# Patient Record
Sex: Female | Born: 1970 | Race: White | Hispanic: No | Marital: Married | Smoking: Never smoker
Health system: Southern US, Community
[De-identification: ages and names within clinical notes are randomized; demographics above are authoritative.]

## PROBLEM LIST (undated history)

## (undated) DIAGNOSIS — T8859XA Other complications of anesthesia, initial encounter: Secondary | ICD-10-CM

## (undated) DIAGNOSIS — Z9889 Other specified postprocedural states: Secondary | ICD-10-CM

## (undated) DIAGNOSIS — R011 Cardiac murmur, unspecified: Secondary | ICD-10-CM

## (undated) DIAGNOSIS — R112 Nausea with vomiting, unspecified: Secondary | ICD-10-CM

## (undated) HISTORY — PX: VAGINAL HYSTERECTOMY: SHX2639

## (undated) HISTORY — PX: TONSILLECTOMY: SUR1361

---

## 2014-05-05 DIAGNOSIS — E785 Hyperlipidemia, unspecified: Secondary | ICD-10-CM | POA: Insufficient documentation

## 2015-05-18 DIAGNOSIS — N809 Endometriosis, unspecified: Secondary | ICD-10-CM | POA: Insufficient documentation

## 2016-06-24 DIAGNOSIS — K824 Cholesterolosis of gallbladder: Secondary | ICD-10-CM | POA: Insufficient documentation

## 2016-06-24 DIAGNOSIS — G43909 Migraine, unspecified, not intractable, without status migrainosus: Secondary | ICD-10-CM | POA: Insufficient documentation

## 2020-03-12 ENCOUNTER — Other Ambulatory Visit: Payer: Self-pay

## 2020-03-12 DIAGNOSIS — Z20822 Contact with and (suspected) exposure to covid-19: Secondary | ICD-10-CM

## 2020-03-14 LAB — SARS-COV-2, NAA 2 DAY TAT

## 2020-03-14 LAB — NOVEL CORONAVIRUS, NAA: SARS-CoV-2, NAA: DETECTED — AB

## 2020-03-16 ENCOUNTER — Other Ambulatory Visit: Payer: Self-pay

## 2020-05-16 ENCOUNTER — Ambulatory Visit: Payer: Managed Care, Other (non HMO) | Admitting: Osteopathic Medicine

## 2020-05-21 ENCOUNTER — Encounter: Payer: Self-pay | Admitting: Osteopathic Medicine

## 2020-05-21 ENCOUNTER — Ambulatory Visit (INDEPENDENT_AMBULATORY_CARE_PROVIDER_SITE_OTHER): Payer: Managed Care, Other (non HMO) | Admitting: Osteopathic Medicine

## 2020-05-21 ENCOUNTER — Other Ambulatory Visit: Payer: Self-pay

## 2020-05-21 VITALS — BP 135/77 | HR 67 | Temp 97.6°F | Wt 149.0 lb

## 2020-05-21 DIAGNOSIS — F419 Anxiety disorder, unspecified: Secondary | ICD-10-CM | POA: Diagnosis not present

## 2020-05-21 DIAGNOSIS — Z0000 Encounter for general adult medical examination without abnormal findings: Secondary | ICD-10-CM

## 2020-05-21 DIAGNOSIS — N941 Unspecified dyspareunia: Secondary | ICD-10-CM

## 2020-05-21 DIAGNOSIS — N951 Menopausal and female climacteric states: Secondary | ICD-10-CM | POA: Diagnosis not present

## 2020-05-21 DIAGNOSIS — Z90710 Acquired absence of both cervix and uterus: Secondary | ICD-10-CM | POA: Diagnosis not present

## 2020-05-21 DIAGNOSIS — Z780 Asymptomatic menopausal state: Secondary | ICD-10-CM | POA: Diagnosis not present

## 2020-05-21 DIAGNOSIS — R232 Flushing: Secondary | ICD-10-CM | POA: Diagnosis not present

## 2020-05-21 NOTE — Patient Instructions (Signed)
Labs ordered for future visit. Annual physical / preventive care was NOT performed or billed today.   If you decide you want Rx for estrogen pill/ patch (systemic) for hot flashes or cream/ suppository (vaginal) for menopausal issues, let me know!

## 2020-05-21 NOTE — Progress Notes (Signed)
Christina Peters is a 50 y.o. female who presents to  [facility] at [facility]  today, 05/21/20, seeking care for the following:  . Establish care . Menopause: s/p hysterectomy and single oopharectomy, (+)hot flashes ongoing a few months now, have tapered off somewhat  . Anxiety: on and off for years, not sure about wanting to be on medication for this.      ASSESSMENT & PLAN with other pertinent findings:  The primary encounter diagnosis was Postmenopausal. Diagnoses of Hot flashes, Dyspareunia in female, H/O hysterectomy for benign disease, Anxiety, and Annual physical exam were also pertinent to this visit.    1. Postmenopausal 2. Hot flashes 3. Dyspareunia in female discussed risk/benefits for estrogen replacement therapy vs SSRI vs no treatment  4. H/O hysterectomy for benign disease  5. Anxiety discsued risk/benefit SSRI/other vs no treatment, pt would like to hold off on Rx at thist ime  6. Annual physical exam Labs ordered for future visit. Annual physical / preventive care was NOT performed or billed today.     Patient Instructions  Labs ordered for future visit. Annual physical / preventive care was NOT performed or billed today.   If you decide you want Rx for estrogen pill/ patch (systemic) for hot flashes or cream/ suppository (vaginal) for menopausal issues, let me know!      Orders Placed This Encounter  Procedures  . CBC  . COMPLETE METABOLIC PANEL WITH GFR  . Lipid panel  . VITAMIN D 25 Hydroxy (Vit-D Deficiency, Fractures)    No orders of the defined types were placed in this encounter.    See below for relevant physical exam findings  See below for recent lab and imaging results reviewed  Medications, allergies, PMH, PSH, SocH, FamH reviewed below    Follow-up instructions: Return in about 4 months (around 09/20/2020) for [place] (labs 2+ days prior to appt, orders are  in).                                        Exam:  BP 135/77 (BP Location: Left Arm, Patient Position: Sitting, Cuff Size: Normal)   Pulse 67   Temp 97.6 F (36.4 C) (Oral)   Wt 149 lb 0.6 oz (67.6 kg)   Constitutional: VS see above. General Appearance: alert, well-developed, well-nourished, NAD  Neck: No masses, trachea midline.   Respiratory: Normal respiratory effort. no wheeze, no rhonchi, no rales  Cardiovascular: S1/S2 normal, no murmur, no rub/gallop auscultated. RRR.   Musculoskeletal: Gait normal. Symmetric and independent movement of all extremities  Abdominal: non-tender, non-distended, no appreciable organomegaly, neg Murphy's, BS WNLx4  Neurological: Normal balance/coordination. No tremor.  Skin: warm, dry, intact.   Psychiatric: Normal judgment/insight. Normal mood and affect. Oriented x3.   No outpatient medications have been marked as taking for the 05/21/20 encounter (Office Visit) with Sunnie Nielsen, DO.    Allergies  Allergen Reactions  . Azithromycin Hives  . Erythromycin Hives  . Erythromycin Base Swelling    There are no problems to display for this patient.   Family History  Problem Relation Age of Onset  . High blood pressure Mother   . Skin cancer Mother   . Heart attack Father   . Parkinson's disease Father     Social History   Tobacco Use  Smoking Status Never Smoker  Smokeless Tobacco Never Used  Past Surgical History:  Procedure Laterality Date  . VAGINAL HYSTERECTOMY      Immunization History  Administered Date(s) Administered  . Influenza Split 10/18/2013  . Influenza,inj,Quad PF,6-35 Mos 11/18/2017, 11/22/2018, 11/21/2019  . Influenza,inj,quad, With Preservative 10/18/2013  . PFIZER(Purple Top)SARS-COV-2 Vaccination 04/30/2019, 05/21/2019  . Tdap 02/17/2014    Recent Results (from the past 2160 hour(s))  Novel Coronavirus, NAA ([facility])     Status: Abnormal   Collection  Time: 03/12/20  2:42 PM   Specimen: Nasopharyngeal(NP) swabs in vial transport medium   Nasopharynge  Result Value Ref Range   SARS-CoV-2, NAA Detected (A) Not Detected    Comment: Patients who have a positive COVID-19 test result may now have treatment options. Treatment options are available for patients with mild to moderate symptoms and for hospitalized patients. Visit our website at [email or web address] for resources and information. This nucleic acid amplification test was developed and its performance characteristics determined by [employer]. Nucleic acid amplification tests include RT-PCR and TMA. This test has not been FDA cleared or approved. This test has been authorized by FDA under an Emergency Use Authorization (EUA). This test is only authorized for the duration of time the declaration that circumstances exist justifying the authorization of the emergency use of in vitro diagnostic tests for detection of SARS-CoV-2 virus and/or diagnosis of COVID-19 infection under section 564(b)(1) of the Act, 21 U.S.C. 174BSW-9(Q) (1), unless the authorization is terminated or revoked sooner. When diagnostic testing is negativ e, the possibility of a false negative result should be considered in the context of a patient's recent exposures and the presence of clinical signs and symptoms consistent with COVID-19. An individual without symptoms of COVID-19 and who is not shedding SARS-CoV-2 virus would expect to have a negative (not detected) result in this assay.   SARS-COV-2, NAA 2 DAY TAT     Status: None   Collection Time: 03/12/20  2:42 PM   Nasopharynge  Result Value Ref Range   SARS-CoV-2, NAA 2 DAY TAT Performed     No results found.     All questions at time of visit were answered - patient instructed to contact office with any additional concerns or updates. ER/RTC precautions were reviewed with the patient as applicable.   Please note: manual  typing as well as voice recognition software may have been used to produce this document - typos may escape review. Please contact Dr. Lyn Hollingshead for any needed clarifications.

## 2020-05-28 ENCOUNTER — Encounter: Payer: Self-pay | Admitting: Osteopathic Medicine

## 2020-06-21 ENCOUNTER — Telehealth: Payer: Self-pay

## 2020-06-21 NOTE — Telephone Encounter (Signed)
Pt called stating she has new insurance coverage. Requesting a call back so that she may submit her new insurance information. Thanks in advance.

## 2020-06-21 NOTE — Telephone Encounter (Signed)
LVM for patient to call me back to see if she can email me a copy of her updated insurance. AM

## 2020-08-22 ENCOUNTER — Other Ambulatory Visit: Payer: Self-pay

## 2020-08-22 DIAGNOSIS — Z1211 Encounter for screening for malignant neoplasm of colon: Secondary | ICD-10-CM

## 2020-08-22 NOTE — Progress Notes (Signed)
Cologuard ordered

## 2020-08-30 LAB — COMPLETE METABOLIC PANEL WITH GFR
AG Ratio: 1.7 (calc) (ref 1.0–2.5)
ALT: 11 U/L (ref 6–29)
AST: 14 U/L (ref 10–35)
Albumin: 4.4 g/dL (ref 3.6–5.1)
Alkaline phosphatase (APISO): 51 U/L (ref 37–153)
BUN: 23 mg/dL (ref 7–25)
CO2: 26 mmol/L (ref 20–32)
Calcium: 9.1 mg/dL (ref 8.6–10.4)
Chloride: 106 mmol/L (ref 98–110)
Creat: 0.71 mg/dL (ref 0.50–1.03)
Globulin: 2.6 g/dL (calc) (ref 1.9–3.7)
Glucose, Bld: 94 mg/dL (ref 65–99)
Potassium: 3.9 mmol/L (ref 3.5–5.3)
Sodium: 141 mmol/L (ref 135–146)
Total Bilirubin: 0.4 mg/dL (ref 0.2–1.2)
Total Protein: 7 g/dL (ref 6.1–8.1)
eGFR: 103 mL/min/{1.73_m2} (ref >=60)

## 2020-08-30 LAB — CBC
HCT: 38.6 % (ref 35.0–45.0)
Hemoglobin: 12.7 g/dL (ref 11.7–15.5)
MCH: 29.7 pg (ref 27.0–33.0)
MCHC: 32.9 g/dL (ref 32.0–36.0)
MCV: 90.4 fL (ref 80.0–100.0)
MPV: 11.4 fL (ref 7.5–12.5)
Platelets: 139 10*3/uL — ABNORMAL LOW (ref 140–400)
RBC: 4.27 10*6/uL (ref 3.80–5.10)
RDW: 12.8 % (ref 11.0–15.0)
WBC: 3.8 10*3/uL (ref 3.8–10.8)

## 2020-08-30 LAB — LIPID PANEL
Cholesterol: 219 mg/dL — ABNORMAL HIGH (ref <200)
HDL: 61 mg/dL (ref >=50)
LDL Cholesterol (Calc): 139 mg/dL (calc) — ABNORMAL HIGH
Non-HDL Cholesterol (Calc): 158 mg/dL (calc) — ABNORMAL HIGH (ref <130)
Total CHOL/HDL Ratio: 3.6 (calc) (ref <5.0)
Triglycerides: 93 mg/dL (ref <150)

## 2020-09-20 ENCOUNTER — Encounter: Payer: Managed Care, Other (non HMO) | Admitting: Osteopathic Medicine

## 2020-10-01 ENCOUNTER — Other Ambulatory Visit: Payer: Self-pay

## 2020-10-01 ENCOUNTER — Ambulatory Visit (INDEPENDENT_AMBULATORY_CARE_PROVIDER_SITE_OTHER): Payer: Managed Care, Other (non HMO) | Admitting: Osteopathic Medicine

## 2020-10-01 VITALS — BP 122/85 | HR 64 | Temp 97.7°F | Wt 147.0 lb

## 2020-10-01 DIAGNOSIS — Z1231 Encounter for screening mammogram for malignant neoplasm of breast: Secondary | ICD-10-CM

## 2020-10-01 DIAGNOSIS — Z0000 Encounter for general adult medical examination without abnormal findings: Secondary | ICD-10-CM

## 2020-10-01 DIAGNOSIS — E782 Mixed hyperlipidemia: Secondary | ICD-10-CM

## 2020-10-01 DIAGNOSIS — Z1211 Encounter for screening for malignant neoplasm of colon: Secondary | ICD-10-CM

## 2020-10-01 DIAGNOSIS — Z780 Asymptomatic menopausal state: Secondary | ICD-10-CM | POA: Insufficient documentation

## 2020-10-01 MED ORDER — ESTRADIOL 1 MG PO TABS: 1.0000 mg | ORAL_TABLET | Freq: Every day | ORAL | 3 refills | Status: DC

## 2020-10-01 NOTE — Progress Notes (Signed)
Christina Peters is a 50 y.o. female who presents to  [facility] at [facility]  today, 10/01/20, seeking care for the following:  Annual physical  Menopausal symptoms - hot flashes and some dyspareunia, would like to start estrogen      ASSESSMENT & PLAN with other pertinent findings:  The primary encounter diagnosis was Annual physical exam. Diagnoses of Mixed hyperlipidemia, Screening for colon cancer, Encounter for screening mammogram for malignant neoplasm of breast, and Menopause were also pertinent to this visit.   The 10-year ASCVD risk score (Goff DC Jr., et al., 2013) is: 1.2%   Values used to calculate the score:     Age: 50 years     Sex: Female     Is Non-Hispanic African American: No     Diabetic: No     Tobacco smoker: No     Systolic Blood Pressure: 122 mmHg     Is BP treated: No     HDL Cholesterol: 61 mg/dL     Total Cholesterol: 219 mg/dL  Patient Instructions  General Preventive Care Most recent routine screening labs: see attached.  Blood pressure goal 130/80 or less.  Tobacco: don't!  Alcohol: responsible moderation is ok for most adults - if you have concerns about your alcohol intake, please talk to me!  Exercise: as tolerated to reduce risk of cardiovascular disease and diabetes. Strength training will also prevent osteoporosis.  Mental health: if need for mental health care (medicines, counseling, other), or concerns about moods, please let me know!  Sexual / Reproductive health: if need for STD testing, or if concerns with libido/pain problems, please let me know!  Advanced Directive: Living Will and/or Healthcare Power of Attorney recommended for all adults, regardless of age or health.  Vaccines Flu vaccine: for almost everyone, every fall.  Shingles vaccine: recommended after age 50.  Pneumonia vaccines: after age 65. Tetanus booster: every 10 years  COVID vaccine: THANKS for getting your vaccine! :)  BOOSTER(S) STRONGLY RECOMMENDED  Cancer screenings  Colon cancer screening: Cologuard stool test ordered. If negative, repeat in 3 years. If positive, need colonoscopy to further evaluate.  Breast cancer screening: mammogram annually after age 50.  Cervical cancer screening: Pap not needed w/ hysterectomy.  Lung cancer screening: not needed for non-smokers  Infection screenings  HIV: recommended screening at least once age 18-65, more often as needed. Gonorrhea/Chlamydia: screening as needed Hepatitis C: recommended once for everyone age 18-75 TB: certain at-risk populations, or depending on work requirements and/or travel history Other Bone Density Test: recommended at age 65  Orders Placed This Encounter  Procedures   MM 3D SCREEN BREAST BILATERAL   Cologuard    Meds ordered this encounter  Medications   estradiol (ESTRACE) 1 MG tablet    Sig: Take 1 tablet (1 mg total) by mouth daily.    Dispense:  90 tablet    Refill:  3      See below for relevant physical exam findings  See below for recent lab and imaging results reviewed  Medications, allergies, PMH, PSH, SocH, FamH reviewed below    Follow-up instructions: Return in about 1 year (around 10/01/2021) for ROUTINE ANNUAL PHYSICAL (OR SOONER IF NEEDED), MYCHART SET TO ALERT YOU TO CALL TO SCHEDULE.                                          Exam:  BP 122/85 (BP Location: Left Arm, Patient Position: Sitting, Cuff Size: Normal)   Pulse 64   Temp 97.7 F (36.5 C) (Oral)   Wt 147 lb 0.6 oz (66.7 kg)  Constitutional: VS see above. General Appearance: alert, well-developed, well-nourished, NAD Neck: No masses, trachea midline.  Respiratory: Normal respiratory effort. no wheeze, no rhonchi, no rales Cardiovascular: S1/S2 normal, no murmur, no rub/gallop auscultated. RRR.  Musculoskeletal: Gait normal. Symmetric and independent movement of all extremities Abdominal: non-tender,  non-distended, no appreciable organomegaly, neg Murphy's, BS WNLx4 Neurological: Normal balance/coordination. No tremor. Skin: warm, dry, intact.  Psychiatric: Normal judgment/insight. Normal mood and affect. Oriented x3.   Current Meds  Medication Sig   estradiol (ESTRACE) 1 MG tablet Take 1 tablet (1 mg total) by mouth daily.    Allergies  Allergen Reactions   Azithromycin Hives   Erythromycin Hives   Erythromycin Base Swelling    Patient Active Problem List   Diagnosis Date Noted   Menopause 10/01/2020   Migraine 06/24/2016   Polyp of gallbladder 06/24/2016   Endometriosis 05/18/2015   Hyperlipidemia 05/05/2014    Family History  Problem Relation Age of Onset   High blood pressure Mother    Skin cancer Mother    Heart attack Father    Parkinson's disease Father     Social History   Tobacco Use  Smoking Status Never  Smokeless Tobacco Never    Past Surgical History:  Procedure Laterality Date   VAGINAL HYSTERECTOMY      Immunization History  Administered Date(s) Administered   Influenza Split 10/18/2013   Influenza,inj,Quad PF,6-35 Mos 11/18/2017, 11/22/2018, 11/21/2019   Influenza,inj,quad, With Preservative 10/18/2013   PFIZER(Purple Top)SARS-COV-2 Vaccination 04/30/2019, 05/21/2019   Tdap 02/17/2014    Recent Results (from the past 2160 hour(s))  CBC     Status: Abnormal   Collection Time: 08/29/20  7:21 AM  Result Value Ref Range   WBC 3.8 3.8 - 10.8 Thousand/uL   RBC 4.27 3.80 - 5.10 Million/uL   Hemoglobin 12.7 11.7 - 15.5 g/dL   HCT 38.6 35.0 - 45.0 %   MCV 90.4 80.0 - 100.0 fL   MCH 29.7 27.0 - 33.0 pg   MCHC 32.9 32.0 - 36.0 g/dL   RDW 12.8 11.0 - 15.0 %   Platelets 139 (L) 140 - 400 Thousand/uL   MPV 11.4 7.5 - 12.5 fL  COMPLETE METABOLIC PANEL WITH GFR     Status: None   Collection Time: 08/29/20  7:21 AM  Result Value Ref Range   Glucose, Bld 94 65 - 99 mg/dL    Comment: .            Fasting reference interval .    BUN 23 7 -  25 mg/dL   Creat 0.71 0.50 - 1.03 mg/dL   eGFR 103 > OR = 60 mL/min/1.73m2    Comment: The eGFR is based on the CKD-EPI 2021 equation. To calculate  the new eGFR from a previous Creatinine or Cystatin C result, go to https://www.kidney.org/professionals/ kdoqi/gfr%5Fcalculator    BUN/Creatinine Ratio NOT APPLICABLE 6 - 22 (calc)   Sodium 141 135 - 146 mmol/L   Potassium 3.9 3.5 - 5.3 mmol/L   Chloride 106 98 - 110 mmol/L   CO2 26 20 - 32 mmol/L   Calcium 9.1 8.6 - 10.4 mg/dL   Total Protein 7.0 6.1 - 8.1 g/dL   Albumin 4.4 3.6 - 5.1 g/dL   Globulin 2.6 1.9 - 3.7 g/dL (calc)     AG Ratio 1.7 1.0 - 2.5 (calc)   Total Bilirubin 0.4 0.2 - 1.2 mg/dL   Alkaline phosphatase (APISO) 51 37 - 153 U/L   AST 14 10 - 35 U/L   ALT 11 6 - 29 U/L  Lipid panel     Status: Abnormal   Collection Time: 08/29/20  7:21 AM  Result Value Ref Range   Cholesterol 219 (H) <200 mg/dL   HDL 61 > OR = 50 mg/dL   Triglycerides 93 <150 mg/dL   LDL Cholesterol (Calc) 139 (H) mg/dL (calc)    Comment: Reference range: <100 . Desirable range <100 mg/dL for primary prevention;   <70 mg/dL for patients with CHD or diabetic patients  with > or = 2 CHD risk factors. . LDL-C is now calculated using the Martin-Hopkins  calculation, which is a validated novel method providing  better accuracy than the Friedewald equation in the  estimation of LDL-C.  Martin SS et al. JAMA. 2013;310(19): 2061-2068  (http://education.[employer].com/faq/FAQ164)    Total CHOL/HDL Ratio 3.6 <5.0 (calc)   Non-HDL Cholesterol (Calc) 158 (H) <130 mg/dL (calc)    Comment: For patients with diabetes plus 1 major ASCVD risk  factor, treating to a non-HDL-C goal of <100 mg/dL  (LDL-C of <70 mg/dL) is considered a therapeutic  option.     No results found.     All questions at time of visit were answered - patient instructed to contact office with any additional concerns or updates. ER/RTC precautions were reviewed with the  patient as applicable.   Please note: manual typing as well as voice recognition software may have been used to produce this document - typos may escape review. Please contact Dr. Alexander for any needed clarifications.   

## 2020-10-01 NOTE — Patient Instructions (Signed)
General Preventive Care Most recent routine screening labs: see attached.  Blood pressure goal 130/80 or less.  Tobacco: don't!  Alcohol: responsible moderation is ok for most adults - if you have concerns about your alcohol intake, please talk to me!  Exercise: as tolerated to reduce risk of cardiovascular disease and diabetes. Strength training will also prevent osteoporosis.  Mental health: if need for mental health care (medicines, counseling, other), or concerns about moods, please let me know!  Sexual / Reproductive health: if need for STD testing, or if concerns with libido/pain problems, please let me know!  Advanced Directive: Living Will and/or Healthcare Power of Attorney recommended for all adults, regardless of age or health.  Vaccines Flu vaccine: for almost everyone, every fall.  Shingles vaccine: recommended after age 86.  Pneumonia vaccines: after age 86. Tetanus booster: every 10 years  COVID vaccine: THANKS for getting your vaccine! :) BOOSTER(S) STRONGLY RECOMMENDED  Cancer screenings  Colon cancer screening: Cologuard stool test ordered. If negative, repeat in 3 years. If positive, need colonoscopy to further evaluate.  Breast cancer screening: mammogram annually after age 2.  Cervical cancer screening: Pap not needed w/ hysterectomy.  Lung cancer screening: not needed for non-smokers  Infection screenings  HIV: recommended screening at least once age 36-65, more often as needed. Gonorrhea/Chlamydia: screening as needed Hepatitis C: recommended once for everyone age 28-75 TB: certain at-risk populations, or depending on work requirements and/or travel history Other Bone Density Test: recommended at age 109

## 2020-10-13 LAB — COLOGUARD: Cologuard: NEGATIVE

## 2020-11-27 ENCOUNTER — Other Ambulatory Visit: Payer: Self-pay | Admitting: Physician Assistant

## 2020-11-27 DIAGNOSIS — Z1231 Encounter for screening mammogram for malignant neoplasm of breast: Secondary | ICD-10-CM

## 2020-11-28 ENCOUNTER — Other Ambulatory Visit: Payer: Self-pay | Admitting: Medical-Surgical

## 2020-11-28 DIAGNOSIS — Z1231 Encounter for screening mammogram for malignant neoplasm of breast: Secondary | ICD-10-CM

## 2020-12-18 ENCOUNTER — Ambulatory Visit: Payer: Managed Care, Other (non HMO) | Admitting: Family Medicine

## 2020-12-24 ENCOUNTER — Ambulatory Visit: Payer: Managed Care, Other (non HMO)

## 2020-12-24 ENCOUNTER — Other Ambulatory Visit: Payer: Self-pay

## 2020-12-24 ENCOUNTER — Ambulatory Visit
Admission: RE | Admit: 2020-12-24 | Discharge: 2020-12-24 | Disposition: A | Discharge status: Home / Self Care | Payer: Managed Care, Other (non HMO) | Source: Ambulatory Visit | Attending: Medical-Surgical | Admitting: Medical-Surgical

## 2021-02-08 ENCOUNTER — Ambulatory Visit: Payer: Managed Care, Other (non HMO) | Admitting: Physician Assistant

## 2021-02-13 ENCOUNTER — Other Ambulatory Visit: Payer: Self-pay

## 2021-02-13 ENCOUNTER — Encounter: Payer: Self-pay | Admitting: Physician Assistant

## 2021-02-13 ENCOUNTER — Ambulatory Visit: Payer: Managed Care, Other (non HMO) | Admitting: Physician Assistant

## 2021-02-13 VITALS — BP 116/74 | HR 58 | Ht 63.5 in | Wt 151.0 lb

## 2021-02-13 DIAGNOSIS — R1114 Bilious vomiting: Secondary | ICD-10-CM | POA: Diagnosis not present

## 2021-02-13 DIAGNOSIS — R195 Other fecal abnormalities: Secondary | ICD-10-CM

## 2021-02-13 DIAGNOSIS — R1011 Right upper quadrant pain: Secondary | ICD-10-CM | POA: Diagnosis not present

## 2021-02-13 MED ORDER — ONDANSETRON 8 MG PO TBDP: 8.0000 mg | ORAL_TABLET | Freq: Three times a day (TID) | ORAL | 0 refills | Status: DC | PRN

## 2021-02-13 NOTE — Progress Notes (Signed)
° °  Subjective:    Patient ID: Christina Peters, female    DOB: 24-Aug-1970, 49 y.o.   MRN: [id number]  HPI Pt is a 50 yo female who presents to the clinic with 6 months of right upper quadrant intermittent discomfort, nausea, vomiting, stool changes. 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 She has had moments of worse discomfort after eating [employer] pizza sub. She is able to control a lot of her symptoms until recently when many things she eats causes her to feel bloated and nauseated. Denies any urinary changes or symptoms or melena or hematochezia. No fever, chills, body aches. No hx of reflux and denies any reflux symptoms.   .. Active Ambulatory Problems    Diagnosis Date Noted   Endometriosis 05/18/2015   Hyperlipidemia 05/05/2014   Migraine 06/24/2016   Polyp of gallbladder 06/24/2016   Menopause 10/01/2020   Resolved Ambulatory Problems    Diagnosis Date Noted   No Resolved Ambulatory Problems   No Additional Past Medical History     Review of Systems See HPI.     Objective:   Physical Exam Vitals reviewed.  Constitutional:      Appearance: Normal appearance.  HENT:     Head: Normocephalic.  Cardiovascular:     Rate and Rhythm: Normal rate and regular rhythm.     Pulses: Normal pulses.     Heart sounds: Normal heart sounds.  Pulmonary:     Effort: Pulmonary effort is normal.  Abdominal:     General: Bowel sounds are normal. There is no distension.     Palpations: Abdomen is soft. There is no mass.     Tenderness: There is abdominal tenderness. There is rebound. There is no right CVA tenderness, left CVA tenderness or guarding.     Comments: Mild Tenderness over RUQ and epigastric area but negative murphys sign.   Neurological:     Mental Status: She is alert.          Assessment & Plan:  Marland KitchenMarland KitchenAnissa was seen today for nausea.  Diagnoses and all orders for this visit:  Right upper quadrant pain -     US Abdomen Complete; Future -      COMPLETE METABOLIC PANEL WITH GFR -     Lipase -     CBC w/Diff/Platelet  Bilious vomiting with nausea -     US Abdomen Complete; Future -     COMPLETE METABOLIC PANEL WITH GFR -     Lipase -     CBC w/Diff/Platelet -     ondansetron (ZOFRAN-ODT) 8 MG disintegrating tablet; Take 1 tablet (8 mg total) by mouth every 8 (eight) hours as needed for nausea.  Change in stool -     US Abdomen Complete; Future -     COMPLETE METABOLIC PANEL WITH GFR -     Lipase -     CBC w/Diff/Platelet   Suspect some gallbladder etiology per pt she has u/s herself and saw stones. Will get official u/s and labs. ? If some GERD as well. Will hold on PPI for now. Zofran for nausea Pt aware of diet triggers to avoid Will refer to [facility] [place] [facility] if needed for surgical consult

## 2021-02-13 NOTE — Patient Instructions (Signed)
Will get u/s and labs to rule out gallstones.  Zofran as needed for nausea

## 2021-02-27 LAB — COMPLETE METABOLIC PANEL WITH GFR
AG Ratio: 1.5 (calc) (ref 1.0–2.5)
ALT: 9 U/L (ref 6–29)
AST: 13 U/L (ref 10–35)
Albumin: 4.5 g/dL (ref 3.6–5.1)
Alkaline phosphatase (APISO): 59 U/L (ref 37–153)
BUN: 23 mg/dL (ref 7–25)
CO2: 27 mmol/L (ref 20–32)
Calcium: 9.5 mg/dL (ref 8.6–10.4)
Chloride: 105 mmol/L (ref 98–110)
Creat: 0.77 mg/dL (ref 0.50–1.03)
Globulin: 3 g/dL (calc) (ref 1.9–3.7)
Glucose, Bld: 90 mg/dL (ref 65–99)
Potassium: 4.4 mmol/L (ref 3.5–5.3)
Sodium: 140 mmol/L (ref 135–146)
Total Bilirubin: 0.4 mg/dL (ref 0.2–1.2)
Total Protein: 7.5 g/dL (ref 6.1–8.1)
eGFR: 93 mL/min/{1.73_m2} (ref >=60)

## 2021-02-27 LAB — CBC WITH DIFFERENTIAL/PLATELET
Absolute Monocytes: 229 cells/uL (ref 200–950)
Basophils Absolute: 41 cells/uL (ref 0–200)
Basophils Relative: 1.1 %
Eosinophils Absolute: 41 cells/uL (ref 15–500)
Eosinophils Relative: 1.1 %
HCT: 39.1 % (ref 35.0–45.0)
Hemoglobin: 13 g/dL (ref 11.7–15.5)
Lymphs Abs: 1513 cells/uL (ref 850–3900)
MCH: 29.3 pg (ref 27.0–33.0)
MCHC: 33.2 g/dL (ref 32.0–36.0)
MCV: 88.3 fL (ref 80.0–100.0)
MPV: 11.8 fL (ref 7.5–12.5)
Monocytes Relative: 6.2 %
Neutro Abs: 1876 cells/uL (ref 1500–7800)
Neutrophils Relative %: 50.7 %
Platelets: 153 10*3/uL (ref 140–400)
RBC: 4.43 10*6/uL (ref 3.80–5.10)
RDW: 12.3 % (ref 11.0–15.0)
Total Lymphocyte: 40.9 %
WBC: 3.7 10*3/uL — ABNORMAL LOW (ref 3.8–10.8)

## 2021-02-27 LAB — LIPASE: Lipase: 16 U/L (ref 7–60)

## 2021-02-28 NOTE — Progress Notes (Signed)
Margia,   Lipase normal. Kidney, liver, glucose look great.  Hemoglobin looks good.  WBC a little low but stable from 6 months ago and differential looks great.

## 2021-03-01 ENCOUNTER — Ambulatory Visit
Admission: RE | Admit: 2021-03-01 | Discharge: 2021-03-01 | Disposition: A | Discharge status: Home / Self Care | Payer: Managed Care, Other (non HMO) | Source: Ambulatory Visit | Attending: Physician Assistant | Admitting: Physician Assistant

## 2021-03-01 ENCOUNTER — Encounter: Payer: Self-pay | Admitting: Physician Assistant

## 2021-03-01 ENCOUNTER — Other Ambulatory Visit: Payer: Self-pay | Admitting: Physician Assistant

## 2021-03-01 DIAGNOSIS — R1011 Right upper quadrant pain: Secondary | ICD-10-CM

## 2021-03-01 DIAGNOSIS — R195 Other fecal abnormalities: Secondary | ICD-10-CM

## 2021-03-01 DIAGNOSIS — K824 Cholesterolosis of gallbladder: Secondary | ICD-10-CM

## 2021-03-01 DIAGNOSIS — K8020 Calculus of gallbladder without cholecystitis without obstruction: Secondary | ICD-10-CM

## 2021-03-01 DIAGNOSIS — R1114 Bilious vomiting: Secondary | ICD-10-CM

## 2021-03-01 DIAGNOSIS — N281 Cyst of kidney, acquired: Secondary | ICD-10-CM | POA: Insufficient documentation

## 2021-03-01 NOTE — Progress Notes (Signed)
Christina Peters,  You do have a gallstone 60mm and gallbladder polyp 67mm. No signs of acute inflammation/infection.  Since you are symptomatic will make referral to [facility] [place] [facility] for consult  You do have  right renal cyst 1cm that will need follow up on in 6- 12 months.

## 2021-03-08 ENCOUNTER — Telehealth: Payer: Self-pay

## 2021-03-08 NOTE — Telephone Encounter (Signed)
Spoke to pt in regards to a referral  that was sent out 03/01/20 pt would like a follow up she states she has yet to receive a call for a schedule appt.

## 2021-04-02 ENCOUNTER — Ambulatory Visit: Payer: Self-pay | Admitting: Surgery

## 2021-05-10 NOTE — Progress Notes (Signed)
Anesthesia Review:  PCP: Cardiologist : Chest x-ray : EKG : Echo : Stress test: Cardiac Cath :  Activity level:  Sleep Study/ CPAP : Fasting Blood Sugar :      / Checks Blood Sugar -- times a day:   Blood Thinner/ Instructions /Last Dose: ASA / Instructions/ Last Dose :  

## 2021-05-13 NOTE — Progress Notes (Addendum)
? ? ? ? ?   DUE TO COVID-19 ONLY ONE VISITOR IS ALLOWED TO COME WITH YOU AND STAY IN THE WAITING ROOM ONLY DURING PRE OP AND PROCEDURE DAY OF SURGERY.  2 VISITOR  MAY VISIT WITH YOU AFTER SURGERY IN YOUR PRIVATE ROOM DURING VISITING HOURS ONLY! ?YOU MAY HAVE ONE PERSON SPEND THE NITE WITH YOU IN YOUR ROOM AFTER SURGERY.   ? ? ? ? Your procedure is scheduled on:  ?           05/30/2021.   ? Report to [facility] Main  Entrance ? ? Report to admitting at   0515             AM ?DO NOT BRING INSURANCE CARD, PICTURE ID OR WALLET DAY OF SURGERY.  ?  ? ? Call this number if you have problems the morning of surgery [phone]  ? ? REMEMBER: NO  SOLID FOODS , CANDY, GUM OR MINTS AFTER MIDNITE THE NITE BEFORE SURGERY .       Marland Kitchen CLEAR LIQUIDS UNTIL      0430am           DAY OF SURGERY.      PLEASE FINISH ENSURE DRINK PER SURGEON ORDER  WHICH NEEDS TO BE COMPLETED AT  0430am        MORNING OF SURGERY.   ? ? ? ? ?CLEAR LIQUID DIET ? ? ?Foods Allowed      ?WATER ?BLACK COFFEE ( SUGAR OK, NO MILK, CREAM OR CREAMER) REGULAR AND DECAF  ?TEA ( SUGAR OK NO MILK, CREAM, OR CREAMER) REGULAR AND DECAF  ?PLAIN JELLO ( NO RED)  ?FRUIT ICES ( NO RED, NO FRUIT PULP)  ?POPSICLES ( NO RED)  ?JUICE- APPLE, WHITE GRAPE AND WHITE CRANBERRY  ?SPORT DRINK LIKE GATORADE ( NO RED)  ?CLEAR BROTH ( VEGETABLE , CHICKEN OR BEEF)                                                               ? ?    ? ?BRUSH YOUR TEETH MORNING OF SURGERY AND RINSE YOUR MOUTH OUT, NO CHEWING GUM CANDY OR MINTS. ?  ? ? Take these medicines the morning of surgery with A SIP OF WATER:  none  ? ? ?DO NOT TAKE ANY DIABETIC MEDICATIONS DAY OF YOUR SURGERY ?                  ?            You may not have any metal on your body including hair pins and  ?            piercings  Do not wear jewelry, make-up, lotions, powders or perfumes, deodorant ?            Do not wear nail polish on your fingernails.   ?           IF YOU ARE A FEMALE AND WANT TO SHAVE UNDER ARMS OR LEGS  PRIOR TO SURGERY YOU MUST DO SO AT LEAST 48 HOURS PRIOR TO SURGERY.  ?            Men may shave face and neck. ? ? Do not bring valuables to the hospital. [facility] IS NOT ?  RESPONSIBLE   FOR VALUABLES. ? Contacts, dentures or bridgework may not be worn into surgery. ? Leave suitcase in the car. After surgery it may be brought to your room. ? ?  ? Patients discharged the day of surgery will not be allowed to drive home. IF YOU ARE HAVING SURGERY AND GOING HOME THE SAME DAY, YOU MUST HAVE AN ADULT TO DRIVE YOU HOME AND BE WITH YOU FOR 24 HOURS. YOU MAY GO HOME BY TAXI OR UBER OR ORTHERWISE, BUT AN ADULT MUST ACCOMPANY YOU HOME AND STAY WITH YOU FOR 24 HOURS. ?  ? ?            Please read over the following fact sheets you were given: ?_____________________________________________________________________ ? ?[facility] - Preparing for Surgery ?Before surgery, you can play an important role.  Because skin is not sterile, your skin needs to be as free of germs as possible.  You can reduce the number of germs on your skin by washing with CHG (chlorahexidine gluconate) soap before surgery.  CHG is an antiseptic cleaner which kills germs and bonds with the skin to continue killing germs even after washing. ?Please DO NOT use if you have an allergy to CHG or antibacterial soaps.  If your skin becomes reddened/irritated stop using the CHG and inform your nurse when you arrive at Short Stay. ?Do not shave (including legs and underarms) for at least 48 hours prior to the first CHG shower.  You may shave your face/neck. ?Please follow these instructions carefully: ? 1.  Shower with CHG Soap the night before surgery and the  morning of Surgery. ? 2.  If you choose to wash your hair, wash your hair first as usual with your  normal  shampoo. ? 3.  After you shampoo, rinse your hair and body thoroughly to remove the  shampoo.                           4.  Use CHG as you would any other liquid soap.  You can apply chg  directly  to the skin and wash  ?                     Gently with a scrungie or clean washcloth. ? 5.  Apply the CHG Soap to your body ONLY FROM THE NECK DOWN.   Do not use on face/ open      ?                     Wound or open sores. Avoid contact with eyes, ears mouth and genitals (private parts).  ?                     Production manager,  Genitals (private parts) with your normal soap. ?            6.  Wash thoroughly, paying special attention to the area where your surgery  will be performed. ? 7.  Thoroughly rinse your body with warm water from the neck down. ? 8.  DO NOT shower/wash with your normal soap after using and rinsing off  the CHG Soap. ?               9.  Pat yourself dry with a clean towel. ?           10.  Wear clean pajamas. ?  11.  Place clean sheets on your bed the night of your first shower and do not  sleep with pets. ?Day of Surgery : ?Do not apply any lotions/deodorants the morning of surgery.  Please wear clean clothes to the hospital/surgery center. ? ?FAILURE TO FOLLOW THESE INSTRUCTIONS MAY RESULT IN THE CANCELLATION OF YOUR SURGERY ?PATIENT SIGNATURE_________________________________ ? ?NURSE SIGNATURE__________________________________ ? ?________________________________________________________________________  ? ? ?           ?

## 2021-05-14 ENCOUNTER — Encounter (HOSPITAL_COMMUNITY)
Admission: RE | Admit: 2021-05-14 | Discharge: 2021-05-14 | Disposition: A | Discharge status: Home / Self Care | Payer: Managed Care, Other (non HMO) | Source: Ambulatory Visit | Attending: Surgery | Admitting: Surgery

## 2021-05-14 ENCOUNTER — Other Ambulatory Visit: Payer: Self-pay

## 2021-05-14 ENCOUNTER — Encounter (HOSPITAL_COMMUNITY): Payer: Self-pay

## 2021-05-14 VITALS — BP 136/85 | HR 68 | Temp 98.5°F | Resp 14 | Ht 64.0 in | Wt 156.0 lb

## 2021-05-14 DIAGNOSIS — Z01812 Encounter for preprocedural laboratory examination: Secondary | ICD-10-CM | POA: Insufficient documentation

## 2021-05-14 DIAGNOSIS — Z01818 Encounter for other preprocedural examination: Secondary | ICD-10-CM

## 2021-05-14 HISTORY — DX: Other specified postprocedural states: Z98.890

## 2021-05-14 HISTORY — DX: Cardiac murmur, unspecified: R01.1

## 2021-05-14 HISTORY — DX: Nausea with vomiting, unspecified: R11.2

## 2021-05-14 HISTORY — DX: Other complications of anesthesia, initial encounter: T88.59XA

## 2021-05-14 LAB — CBC
HCT: 36.9 % (ref 36.0–46.0)
Hemoglobin: 12.6 g/dL (ref 12.0–15.0)
MCH: 30.2 pg (ref 26.0–34.0)
MCHC: 34.1 g/dL (ref 30.0–36.0)
MCV: 88.5 fL (ref 80.0–100.0)
Platelets: 165 10*3/uL (ref 150–400)
RBC: 4.17 MIL/uL (ref 3.87–5.11)
RDW: 12.9 % (ref 11.5–15.5)
WBC: 5.5 10*3/uL (ref 4.0–10.5)
nRBC: 0 % (ref 0.0–0.2)

## 2021-05-24 ENCOUNTER — Encounter (HOSPITAL_COMMUNITY): Payer: Self-pay | Admitting: Surgery

## 2021-05-24 DIAGNOSIS — K8050 Calculus of bile duct without cholangitis or cholecystitis without obstruction: Secondary | ICD-10-CM | POA: Diagnosis present

## 2021-05-24 NOTE — H&P (Signed)
? ? ? ? ?REFERRING PHYSICIAN: Tandy Gaw, PA ? ?PROVIDER: Analiese Krupka Myra Rude, MD ? ?Chief Complaint: New Consultation (Symptomatic cholelithiasis, gallbladder polyp) ? ? ?History of Present Illness: ? ?Patient is referred by Tandy Gaw, PA C, for surgical evaluation and management of symptomatic cholelithiasis and gallbladder polyp. 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 She has a known gallbladder polyp for approximately 5 years. She has known cholelithiasis for approximately 2-1/2 years. Over the past year she has developed intermittent symptoms of epigastric and right upper quadrant abdominal discomfort and bloating, nausea, and occasional emesis. These appear to be related to meals although not to any particular type of food. They also occur late at night after a late meal. They may last for several hours. There is no history of jaundice or acholic stools. There is no fever or chills. Ultrasound examination on March 01, 2021 shows cholelithiasis with stones measuring up to 11 mm. There is also a gallbladder polyp measuring 3 mm. There are no acute inflammatory changes. Previous abdominal surgery includes hysterectomy. There is a family history of gallbladder disease in a maternal grandmother and a history of cholecystectomy in the patient's daughter at age 89 years due to biliary dyskinesia. Patient now presents for consideration for cholecystectomy for symptomatic cholelithiasis and gallbladder polyp. ? ?Review of Systems: ?A complete review of systems was obtained from the patient. I have reviewed this information and discussed as appropriate with the patient. See HPI as well for other ROS. ? ?Review of Systems  ?Constitutional: Negative.  ?HENT: Negative.  ?Eyes: Negative.  ?Respiratory: Negative.  ?Cardiovascular: Negative.  ?Gastrointestinal: Positive for abdominal pain, nausea and vomiting.  ?Genitourinary: Negative.  ?Musculoskeletal: Negative.  ?Skin: Negative.   ?Neurological: Negative.  ?Endo/Heme/Allergies: Negative.  ?Psychiatric/Behavioral: Negative.  ? ? ?Medical History: ?History reviewed. No pertinent past medical history. ? ?Patient Active Problem List  ?Diagnosis  ? Calculus of gallbladder without cholecystitis without obstruction  ? Biliary colic  ? Gallbladder polyp  ? ?Past Surgical History:  ?Procedure Laterality Date  ? COMBINED HYSTERECTOMY VAGINAL / OOPHORECTOMY / A&P REPAIR  ?2016  ? TONSILLECTOMY  ? ? ?Allergies  ?Allergen Reactions  ? Erythromycin Base Swelling  ? ?Current Outpatient Medications on File Prior to Visit  ?Medication Sig Dispense Refill  ? estradioL (ESTRACE) 1 MG tablet Take 1 mg by mouth once daily  ? ?No current facility-administered medications on file prior to visit.  ? ?Family History  ?Problem Relation Age of Onset  ? High blood pressure (Hypertension) Mother  ? High blood pressure (Hypertension) Father  ? Hyperlipidemia (Elevated cholesterol) Father  ? Coronary Artery Disease (Blocked arteries around heart) Father  ? ? ?Social History  ? ?Tobacco Use  ?Smoking Status Never  ?Smokeless Tobacco Never  ? ? ?Social History  ? ?Socioeconomic History  ? Marital status: Married  ?Tobacco Use  ? Smoking status: Never  ? Smokeless tobacco: Never  ?Vaping Use  ? Vaping Use: Never used  ?Substance and Sexual Activity  ? Alcohol use: Never  ? Drug use: Never  ? ?Objective:  ? ?Vitals:  ?BP: 120/74  ?Pulse: 84  ?Temp: 36.7 ?C (98 ?F)  ?SpO2: 99%  ?Weight: 70.5 kg (155 lb 6.4 oz)  ?Height: 162.6 cm (5\' 4" )  ? ?Body mass index is 26.67 kg/m?. ? ?Physical Exam  ? ?GENERAL APPEARANCE ?Development: normal ?Nutritional status: normal ?Gross deformities: none ? ?SKIN ?Rash, lesions, ulcers: none ?Induration, erythema: none ?Nodules: none palpable ? ?EYES ?Conjunctiva and lids: normal ?Pupils:  equal and reactive ?Iris: normal bilaterally ? ?EARS, NOSE, MOUTH, THROAT ?External ears: no lesion or deformity ?External nose: no lesion or deformity ?Hearing:  grossly normal ?Due to Covid-19 pandemic, patient is wearing a mask. ? ?NECK ?Symmetric: yes ?Trachea: midline ?Thyroid: no palpable nodules in the thyroid bed ? ?CHEST ?Respiratory effort: normal ?Retraction or accessory muscle use: no ?Breath sounds: normal bilaterally ?Rales, rhonchi, wheeze: none ? ?CARDIOVASCULAR ?Auscultation: regular rhythm, normal rate ?Murmurs: none ?Pulses: radial pulse 2+ palpable ?Lower extremity edema: none ? ?ABDOMEN ?Distension: none ?Masses: none palpable ?Tenderness: none ?Hepatosplenomegaly: not present ?Hernia: not present ? ?MUSCULOSKELETAL ?Station and gait: normal ?Digits and nails: no clubbing or cyanosis ?Muscle strength: grossly normal all extremities ?Range of motion: grossly normal all extremities ?Deformity: none ? ?LYMPHATIC ?Cervical: none palpable ?Supraclavicular: none palpable ? ?PSYCHIATRIC ?Oriented to person, place, and time: yes ?Mood and affect: normal for situation ?Judgment and insight: appropriate for situation ? ? ?Assessment and Plan:  ? ?Calculus of gallbladder without cholecystitis without obstruction ? ?Biliary colic ? ?Gallbladder polyp ? ? ?Patient presents today on referral from her primary care provider for surgical evaluation and management of symptomatic cholelithiasis and gallbladder polyp. Patient has had intermittent episodes of abdominal pain, nausea, and emesis over the past several months. This seems to be precipitated mainly by meals. Ultrasound demonstrates multiple gallstones as well as a small gallbladder polyp. ? ?Today we discussed laparoscopic cholecystectomy. We discussed performing intraoperative cholangiography. We discussed the size and location of the surgical incisions. We discussed the potential for conversion to open surgery. We discussed the hospital course to be anticipated and the postoperative recovery and return to work and activities. The patient understands and wishes to proceed with surgery in the near  future. ? ?Patient is provided with written literature on laparoscopic cholecystectomy to review at home. ? ?The risks and benefits of the procedure have been discussed at length with the patient. The patient understands the proposed procedure, potential alternative treatments, and the course of recovery to be expected. All of the patient's questions have been answered at this time. The patient wishes to proceed with surgery.  ? ?[name], MD ?[facility] ?A [facility] practice ?Office: [phone] ? ?

## 2021-05-29 NOTE — Anesthesia Preprocedure Evaluation (Addendum)
Anesthesia Evaluation  ?Patient identified by MRN, date of birth, ID band ?Patient awake ? ? ? ?Reviewed: ?Allergy & Precautions, H&P , NPO status , Patient's Chart, lab work & pertinent test results ? ?History of Anesthesia Complications ?(+) PONV and history of anesthetic complications ? ?Airway ?Mallampati: II ? ?TM Distance: >3 FB ?Neck ROM: Full ? ? ? Dental ?no notable dental hx. ?(+) Teeth Intact, Dental Advisory Given ?  ?Pulmonary ?neg pulmonary ROS,  ?  ?Pulmonary exam normal ?breath sounds clear to auscultation ? ? ? ? ? ? Cardiovascular ?Exercise Tolerance: Good ?negative cardio ROS ?Normal cardiovascular exam ?Rhythm:Regular Rate:Normal ? ? ?  ?Neuro/Psych ? Headaches, negative psych ROS  ? GI/Hepatic ?negative GI ROS, Neg liver ROS,   ?Endo/Other  ?negative endocrine ROS ? Renal/GU ?negative Renal ROS  ?negative genitourinary ?  ?Musculoskeletal ?negative musculoskeletal ROS ?(+)  ? Abdominal ?  ?Peds ?negative pediatric ROS ?(+)  Hematology ?negative hematology ROS ?(+)   ?Anesthesia Other Findings ? ? Reproductive/Obstetrics ?negative OB ROS ? ?  ? ? ? ? ? ? ? ? ? ? ? ? ? ?  ?  ? ? ? ? ? ? ? ?Anesthesia Physical ?Anesthesia Plan ? ?ASA: 2 ? ?Anesthesia Plan: General  ? ?Post-op Pain Management: Minimal or no pain anticipated  ? ?Induction:  ? ?PONV Risk Score and Plan: 3 and Ondansetron, Dexamethasone and Scopolamine patch - Pre-op ? ?Airway Management Planned: Oral ETT ? ?Additional Equipment: None ? ?Intra-op Plan:  ? ?Post-operative Plan: Extubation in OR ? ?Informed Consent: I have reviewed the patients History and Physical, chart, labs and discussed the procedure including the risks, benefits and alternatives for the proposed anesthesia with the patient or authorized representative who has indicated his/her understanding and acceptance.  ? ? ? ? ? ?Plan Discussed with: Anesthesiologist and CRNA ? ?Anesthesia Plan Comments:   ? ? ? ? ? ?Anesthesia Quick  Evaluation ? ?

## 2021-05-30 ENCOUNTER — Ambulatory Visit (HOSPITAL_COMMUNITY): Payer: Managed Care, Other (non HMO)

## 2021-05-30 ENCOUNTER — Ambulatory Visit (HOSPITAL_COMMUNITY): Payer: Managed Care, Other (non HMO) | Admitting: Anesthesiology

## 2021-05-30 ENCOUNTER — Other Ambulatory Visit: Payer: Self-pay

## 2021-05-30 ENCOUNTER — Ambulatory Visit (HOSPITAL_BASED_OUTPATIENT_CLINIC_OR_DEPARTMENT_OTHER): Payer: Managed Care, Other (non HMO) | Admitting: Anesthesiology

## 2021-05-30 ENCOUNTER — Encounter (HOSPITAL_COMMUNITY)
Admission: RE | Disposition: A | Discharge status: Home / Self Care | Payer: Self-pay | Source: Ambulatory Visit | Attending: Surgery

## 2021-05-30 ENCOUNTER — Encounter (HOSPITAL_COMMUNITY): Payer: Self-pay | Admitting: Surgery

## 2021-05-30 ENCOUNTER — Ambulatory Visit (HOSPITAL_COMMUNITY)
Admission: RE | Admit: 2021-05-30 | Discharge: 2021-05-30 | Disposition: A | Discharge status: Home / Self Care | Payer: Managed Care, Other (non HMO) | Source: Ambulatory Visit | Attending: Surgery | Admitting: Surgery

## 2021-05-30 DIAGNOSIS — K8010 Calculus of gallbladder with chronic cholecystitis without obstruction: Secondary | ICD-10-CM | POA: Diagnosis present

## 2021-05-30 DIAGNOSIS — K8050 Calculus of bile duct without cholangitis or cholecystitis without obstruction: Secondary | ICD-10-CM | POA: Diagnosis present

## 2021-05-30 DIAGNOSIS — K8020 Calculus of gallbladder without cholecystitis without obstruction: Secondary | ICD-10-CM | POA: Diagnosis present

## 2021-05-30 DIAGNOSIS — Z8379 Family history of other diseases of the digestive system: Secondary | ICD-10-CM | POA: Diagnosis not present

## 2021-05-30 DIAGNOSIS — K824 Cholesterolosis of gallbladder: Secondary | ICD-10-CM | POA: Diagnosis present

## 2021-05-30 HISTORY — PX: CHOLECYSTECTOMY: SHX55

## 2021-05-30 SURGERY — LAPAROSCOPIC CHOLECYSTECTOMY
Anesthesia: General | Site: Abdomen

## 2021-05-30 MED ORDER — BUPIVACAINE-EPINEPHRINE (PF) 0.5% -1:200000 IJ SOLN
INTRAMUSCULAR | Status: AC
  Filled 2021-05-30: qty 30

## 2021-05-30 MED ORDER — ONDANSETRON HCL 4 MG/2ML IJ SOLN
INTRAMUSCULAR | Status: AC
  Filled 2021-05-30: qty 2

## 2021-05-30 MED ORDER — ACETAMINOPHEN 325 MG PO TABS: 325.0000 mg | ORAL_TABLET | ORAL | Status: DC | PRN

## 2021-05-30 MED ORDER — LIDOCAINE HCL (PF) 2 % IJ SOLN
INTRAMUSCULAR | Status: AC
  Filled 2021-05-30: qty 5

## 2021-05-30 MED ORDER — FENTANYL CITRATE (PF) 250 MCG/5ML IJ SOLN
INTRAMUSCULAR | Status: DC | PRN
Start: 2021-05-30 — End: 2021-05-30
  Administered 2021-05-30: 50 ug via INTRAVENOUS
  Administered 2021-05-30: 100 ug via INTRAVENOUS
  Administered 2021-05-30 (×2): 50 ug via INTRAVENOUS
  Administered 2021-05-30: 100 ug via INTRAVENOUS

## 2021-05-30 MED ORDER — ROCURONIUM BROMIDE 10 MG/ML (PF) SYRINGE
PREFILLED_SYRINGE | INTRAVENOUS | Status: AC
  Filled 2021-05-30: qty 10

## 2021-05-30 MED ORDER — ACETAMINOPHEN 160 MG/5ML PO SOLN: 325.0000 mg | ORAL | Status: DC | PRN

## 2021-05-30 MED ORDER — PROPOFOL 1000 MG/100ML IV EMUL
INTRAVENOUS | Status: AC
  Filled 2021-05-30: qty 100

## 2021-05-30 MED ORDER — SCOPOLAMINE 1 MG/3DAYS TD PT72
1.0000 | MEDICATED_PATCH | TRANSDERMAL | Status: DC
  Administered 2021-05-30: 1.5 mg via TRANSDERMAL
  Filled 2021-05-30: qty 1

## 2021-05-30 MED ORDER — OXYCODONE HCL 5 MG/5ML PO SOLN: 5.0000 mg | Freq: Once | ORAL | Status: DC | PRN

## 2021-05-30 MED ORDER — ONDANSETRON HCL 4 MG/2ML IJ SOLN
INTRAMUSCULAR | Status: DC | PRN
  Administered 2021-05-30: 4 mg via INTRAVENOUS

## 2021-05-30 MED ORDER — DEXAMETHASONE SODIUM PHOSPHATE 10 MG/ML IJ SOLN
INTRAMUSCULAR | Status: AC
  Filled 2021-05-30: qty 1

## 2021-05-30 MED ORDER — DEXAMETHASONE SODIUM PHOSPHATE 10 MG/ML IJ SOLN
INTRAMUSCULAR | Status: DC | PRN
  Administered 2021-05-30: 5 mg via INTRAVENOUS

## 2021-05-30 MED ORDER — TRAMADOL HCL 50 MG PO TABS: 50.0000 mg | ORAL_TABLET | Freq: Four times a day (QID) | ORAL | 0 refills | Status: DC | PRN

## 2021-05-30 MED ORDER — SUGAMMADEX SODIUM 200 MG/2ML IV SOLN
INTRAVENOUS | Status: DC | PRN
  Administered 2021-05-30: 200 mg via INTRAVENOUS

## 2021-05-30 MED ORDER — IOHEXOL 300 MG/ML  SOLN
INTRAMUSCULAR | Status: DC | PRN
  Administered 2021-05-30: 7 mL

## 2021-05-30 MED ORDER — MEPERIDINE HCL 50 MG/ML IJ SOLN: 6.2500 mg | INTRAMUSCULAR | Status: DC | PRN

## 2021-05-30 MED ORDER — CHLORHEXIDINE GLUCONATE 0.12 % MT SOLN
15.0000 mL | Freq: Once | OROMUCOSAL | Status: AC
  Administered 2021-05-30: 15 mL via OROMUCOSAL

## 2021-05-30 MED ORDER — FENTANYL CITRATE (PF) 250 MCG/5ML IJ SOLN
INTRAMUSCULAR | Status: AC
  Filled 2021-05-30: qty 5

## 2021-05-30 MED ORDER — ONDANSETRON HCL 4 MG/2ML IJ SOLN
4.0000 mg | Freq: Once | INTRAMUSCULAR | Status: AC | PRN
  Administered 2021-05-30: 4 mg via INTRAVENOUS

## 2021-05-30 MED ORDER — CEFAZOLIN SODIUM-DEXTROSE 2-4 GM/100ML-% IV SOLN
2.0000 g | INTRAVENOUS | Status: AC
  Administered 2021-05-30: 2 g via INTRAVENOUS
  Filled 2021-05-30: qty 100

## 2021-05-30 MED ORDER — CHLORHEXIDINE GLUCONATE CLOTH 2 % EX PADS: 6.0000 | MEDICATED_PAD | Freq: Once | CUTANEOUS | Status: DC

## 2021-05-30 MED ORDER — FENTANYL CITRATE PF 50 MCG/ML IJ SOSY
PREFILLED_SYRINGE | INTRAMUSCULAR | Status: AC
  Filled 2021-05-30: qty 2

## 2021-05-30 MED ORDER — MIDAZOLAM HCL 2 MG/2ML IJ SOLN
INTRAMUSCULAR | Status: DC | PRN
  Administered 2021-05-30: 2 mg via INTRAVENOUS

## 2021-05-30 MED ORDER — BUPIVACAINE LIPOSOME 1.3 % IJ SUSP: 20.0000 mL | Freq: Once | INTRAMUSCULAR | Status: DC

## 2021-05-30 MED ORDER — ROCURONIUM BROMIDE 10 MG/ML (PF) SYRINGE
PREFILLED_SYRINGE | INTRAVENOUS | Status: DC | PRN
  Administered 2021-05-30: 20 mg via INTRAVENOUS
  Administered 2021-05-30: 60 mg via INTRAVENOUS

## 2021-05-30 MED ORDER — FENTANYL CITRATE PF 50 MCG/ML IJ SOSY
25.0000 ug | PREFILLED_SYRINGE | INTRAMUSCULAR | Status: DC | PRN
  Administered 2021-05-30 (×2): 50 ug via INTRAVENOUS

## 2021-05-30 MED ORDER — LACTATED RINGERS IV SOLN: INTRAVENOUS | Status: DC

## 2021-05-30 MED ORDER — LIDOCAINE 2% (20 MG/ML) 5 ML SYRINGE
INTRAMUSCULAR | Status: DC | PRN
  Administered 2021-05-30: 60 mg via INTRAVENOUS

## 2021-05-30 MED ORDER — FENTANYL CITRATE (PF) 100 MCG/2ML IJ SOLN
INTRAMUSCULAR | Status: AC
  Filled 2021-05-30: qty 2

## 2021-05-30 MED ORDER — PROPOFOL 10 MG/ML IV BOLUS
INTRAVENOUS | Status: DC | PRN
Start: 2021-05-30 — End: 2021-05-30
  Administered 2021-05-30: 150 mg via INTRAVENOUS

## 2021-05-30 MED ORDER — LACTATED RINGERS IR SOLN
Status: DC | PRN
  Administered 2021-05-30: 1000 mL

## 2021-05-30 MED ORDER — PROPOFOL 500 MG/50ML IV EMUL
INTRAVENOUS | Status: DC | PRN
  Administered 2021-05-30: 150 ug/kg/min via INTRAVENOUS

## 2021-05-30 MED ORDER — 0.9 % SODIUM CHLORIDE (POUR BTL) OPTIME
TOPICAL | Status: DC | PRN
  Administered 2021-05-30: 1000 mL

## 2021-05-30 MED ORDER — ORAL CARE MOUTH RINSE: 15.0000 mL | Freq: Once | OROMUCOSAL | Status: AC

## 2021-05-30 MED ORDER — BUPIVACAINE-EPINEPHRINE 0.5% -1:200000 IJ SOLN
INTRAMUSCULAR | Status: DC | PRN
Start: 2021-05-30 — End: 2021-05-30
  Administered 2021-05-30: 30 mL

## 2021-05-30 MED ORDER — PROPOFOL 10 MG/ML IV BOLUS
INTRAVENOUS | Status: AC
Start: 2021-05-30 — End: ?
  Filled 2021-05-30: qty 20

## 2021-05-30 MED ORDER — OXYCODONE HCL 5 MG PO TABS: 5.0000 mg | ORAL_TABLET | Freq: Once | ORAL | Status: DC | PRN

## 2021-05-30 MED ORDER — PROPOFOL 500 MG/50ML IV EMUL
INTRAVENOUS | Status: AC
  Filled 2021-05-30: qty 50

## 2021-05-30 MED ORDER — MIDAZOLAM HCL 2 MG/2ML IJ SOLN
INTRAMUSCULAR | Status: AC
Start: 2021-05-30 — End: ?
  Filled 2021-05-30: qty 2

## 2021-05-30 SURGICAL SUPPLY — 41 items
APPLIER CLIP ROT 10 11.4 M/L (STAPLE) ×2
BAG COUNTER SPONGE SURGICOUNT (BAG) IMPLANT
BAG RETRIEVAL 10 (BASKET) ×1
CABLE HIGH FREQUENCY MONO STRZ (ELECTRODE) ×2 IMPLANT
CHLORAPREP W/TINT 26 (MISCELLANEOUS) ×2 IMPLANT
CLIP APPLIE ROT 10 11.4 M/L (STAPLE) ×1 IMPLANT
COVER MAYO STAND STRL (DRAPES) ×1 IMPLANT
COVER SURGICAL LIGHT HANDLE (MISCELLANEOUS) ×2 IMPLANT
DERMABOND ADVANCED (GAUZE/BANDAGES/DRESSINGS) ×1
DERMABOND ADVANCED .7 DNX12 (GAUZE/BANDAGES/DRESSINGS) IMPLANT
DRAPE C-ARM 42X120 X-RAY (DRAPES) ×1 IMPLANT
ELECT REM PT RETURN 15FT ADLT (MISCELLANEOUS) ×2 IMPLANT
GLOVE SURG ORTHO 8.0 STRL STRW (GLOVE) ×2 IMPLANT
GLOVE SURG SYN 7.5  E (GLOVE) ×1
GLOVE SURG SYN 7.5 E (GLOVE) ×1 IMPLANT
GLOVE SURG SYN 7.5 PF PI (GLOVE) ×1 IMPLANT
GOWN STRL REUS W/ TWL XL LVL3 (GOWN DISPOSABLE) ×2 IMPLANT
GOWN STRL REUS W/TWL XL LVL3 (GOWN DISPOSABLE) ×2
HEMOSTAT SURGICEL 4X8 (HEMOSTASIS) IMPLANT
IRRIG SUCT STRYKERFLOW 2 WTIP (MISCELLANEOUS) ×2
IRRIGATION SUCT STRKRFLW 2 WTP (MISCELLANEOUS) ×1 IMPLANT
KIT BASIN OR (CUSTOM PROCEDURE TRAY) ×2 IMPLANT
KIT TURNOVER KIT A (KITS) ×1 IMPLANT
PAD POSITIONING PINK XL (MISCELLANEOUS) IMPLANT
PENCIL SMOKE EVACUATOR (MISCELLANEOUS) ×1 IMPLANT
SCISSORS LAP 5X35 DISP (ENDOMECHANICALS) ×2 IMPLANT
SET CHOLANGIOGRAPH MIX (MISCELLANEOUS) ×2 IMPLANT
SET TUBE SMOKE EVAC HIGH FLOW (TUBING) ×2 IMPLANT
SLEEVE XCEL OPT CAN 5 100 (ENDOMECHANICALS) ×2 IMPLANT
SPIKE FLUID TRANSFER (MISCELLANEOUS) ×2 IMPLANT
STRIP CLOSURE SKIN 1/2X4 (GAUZE/BANDAGES/DRESSINGS) ×2 IMPLANT
SUT MNCRL AB 4-0 PS2 18 (SUTURE) ×1 IMPLANT
SUT VIC AB 4-0 PS2 27 (SUTURE) ×2 IMPLANT
SYS BAG RETRIEVAL 10MM (BASKET) ×1
SYSTEM BAG RETRIEVAL 10MM (BASKET) ×1 IMPLANT
TAPE CLOTH 4X10 WHT NS (GAUZE/BANDAGES/DRESSINGS) IMPLANT
TOWEL OR 17X26 10 PK STRL BLUE (TOWEL DISPOSABLE) ×2 IMPLANT
TRAY LAPAROSCOPIC (CUSTOM PROCEDURE TRAY) ×2 IMPLANT
TROCAR BLADELESS OPT 5 100 (ENDOMECHANICALS) ×2 IMPLANT
TROCAR XCEL BLUNT TIP 100MML (ENDOMECHANICALS) ×2 IMPLANT
TROCAR XCEL NON-BLD 11X100MML (ENDOMECHANICALS) ×2 IMPLANT

## 2021-05-30 NOTE — Interval H&P Note (Signed)
History and Physical Interval Note: ? ?05/30/2021 ?7:00 AM ? ?Christina Peters  has presented today for surgery, with the diagnosis of SYMPTOMATIC CHOLELITHIASIS, GALLBLADDER POLYP.  The various methods of treatment have been discussed with the patient and family. After consideration of risks, benefits and other options for treatment, the patient has consented to  ? ? Procedure(s): ?LAPAROSCOPIC CHOLECYSTECTOMY WITH INTRAOPERATIVE CHOLANGIOGRAM (N/A) as a surgical intervention.   ? ?The patient's history has been reviewed, patient examined, no change in status, stable for surgery.  I have reviewed the patient's chart and labs.  Questions were answered to the patient's satisfaction.   ? ?[name], MD ?[facility] ?A [facility] practice ?Office: [phone] ? ? ?[name] ? ? ?

## 2021-05-30 NOTE — Transfer of Care (Signed)
Immediate Anesthesia Transfer of Care Note ? ?Patient: Christina Peters ? ?Procedure(s) Performed: LAPAROSCOPIC CHOLECYSTECTOMY WITH INTRAOPERATIVE CHOLANGIOGRAM (Abdomen) ? ?Patient Location: PACU ? ?Anesthesia Type:General ? ?Level of Consciousness: awake, drowsy and responds to stimulation ? ?Airway & Oxygen Therapy: Patient Spontanous Breathing and Patient connected to face mask oxygen ? ?Post-op Assessment: Report given to RN and Post -op Vital signs reviewed and stable ? ?Post vital signs: Reviewed and stable ? ?Last Vitals:  ?Vitals Value Taken Time  ?BP 134/72 05/30/21 0858  ?Temp    ?Pulse 57 05/30/21 0859  ?Resp 14 05/30/21 0859  ?SpO2 100 % 05/30/21 0859  ?Vitals shown include unvalidated device data. ? ?Last Pain:  ?Vitals:  ? 05/30/21 0605  ?TempSrc:   ?PainSc: 0-No pain  ?   ? ?  ? ?Complications: No notable events documented. ?

## 2021-05-30 NOTE — Op Note (Addendum)
Procedure Note ? ?Pre-operative Diagnosis:  symptomatic cholelithiasis and gallbladder polyp ? ?Post-operative Diagnosis:  same ? ?Surgeon:  [name], MD ? ?Resident Surgeon:  Benna Dunks, MD  ? ?Procedure:  Laparoscopic cholecystectomy with intra-operative cholangiography ? ?Anesthesia:  General ? ?Estimated Blood Loss:  minimal ? ?Drains: none ?        ?Specimen: gallbladder to pathology ? ?Indications:  Patient is referred by Tandy Gaw, PA C, for surgical evaluation and management of symptomatic cholelithiasis and gallbladder polyp. 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 She has a known gallbladder polyp for approximately 5 years. She has known cholelithiasis for approximately 2-1/2 years. Over the past year she has developed intermittent symptoms of epigastric and right upper quadrant abdominal discomfort and bloating, nausea, and occasional emesis. These appear to be related to meals although not to any particular type of food. They also occur late at night after a late meal. They may last for several hours. There is no history of jaundice or acholic stools. There is no fever or chills. Ultrasound examination on March 01, 2021 shows cholelithiasis with stones measuring up to 11 mm. There is also a gallbladder polyp measuring 3 mm. There are no acute inflammatory changes. Previous abdominal surgery includes hysterectomy. There is a family history of gallbladder disease in a maternal grandmother and a history of cholecystectomy in the patient's daughter at age 69 years due to biliary dyskinesia. Patient now presents for consideration for cholecystectomy for symptomatic cholelithiasis and gallbladder polyp. ? ?I was present for the critical and key portions of the surgery and I was immediately available throughout the entire procedure.  I have reviewed and agree with the operative note as documented by the resident.  ? ?Procedure description: The patient was seen in the pre-op holding  area. The risks, benefits, complications, treatment options, and expected outcomes were previously discussed with the patient. The patient agreed with the proposed plan and has signed the informed consent form. ? ?The patient was transported to operating room # 1 at the [facility]. The patient was placed in the supine position on the operating room table. Following induction of general anesthesia, the abdomen was prepped and draped in the usual aseptic fashion. ? ?An incision was made in the skin near the umbilicus. The midline fascia was incised and the peritoneal cavity was entered and a Hasson cannula was introduced under direct vision. The cannula was secured with a 0-Vicryl pursestring suture. Pneumoperitoneum was established with carbon dioxide. Additional cannulae were introduced under direct vision along the right costal margin in the midline, mid-clavicular line, and anterior axillary line. ?  ?The gallbladder was identified and the fundus grasped and retracted cephalad. The infundibulum was grasped and retracted laterally, exposing the peritoneum overlying the triangle of Calot. The peritoneum was incised and structures exposed with blunt dissection. The cystic duct was clearly identified, bluntly dissected circumferentially, and clipped at the neck of the gallbladder. The cystic artery was identified, dissected circumferentially and clipped proximally. ? ?An incision was made in the cystic duct and the cholangiogram catheter introduced. The catheter was secured using an ligaclip.  Real-time cholangiography was performed using C-arm fluoroscopy.  There was rapid filling of a normal caliber common bile duct.  There was reflux of contrast into the left and right hepatic ductal systems.  There was free flow distally into the duodenum without filling defect or obstruction.  The catheter was removed from the peritoneal cavity. ? ?The cystic duct was then ligated with  ligaclips and divided. The cystic  artery was divided distal to the clips. ? ?The gallbladder was dissected away from the gallbladder bed using the electrocautery for hemostasis. The gallbladder was completely removed from the liver and placed into an endocatch bag. The gallbladder was removed in the endocatch bag through the umbilical port site and submitted to pathology for review. ? ?The right upper quadrant was irrigated and the gallbladder bed was inspected. Hemostasis was achieved with the electrocautery. ? ?Cannulae were removed under direct vision and good hemostasis was noted. Pneumoperitoneum was released and the majority of the carbon dioxide evacuated. The umbilical wound was irrigated and the fascia was then closed with the pursestring suture.  Local anesthetic was infiltrated at all port sites. Skin incisions were closed with 4-0 Monocril subcuticular sutures and Dermabond was applied. ? ?Instrument, sponge, and needle counts were correct at the conclusion of the case.  The patient was awakened from anesthesia and brought to the recovery room in stable condition.  The patient tolerated the procedure well. ? ?Benna Dunks, MD ?[facility] Resident ? ? ?[name], MD ?[facility] ?Office: [phone] ? ? ?

## 2021-05-30 NOTE — Discharge Instructions (Addendum)
[facility]  LAPAROSCOPIC SURGERY:  POST-OP INSTRUCTIONS  Always review your discharge instruction sheet given to you by the facility where your surgery was performed.  A prescription for pain medication may be given to you upon discharge.  Take your pain medication as prescribed.  If narcotic pain medicine is not needed, then you may take acetaminophen (Tylenol) or ibuprofen (Advil) as needed.  Take your usually prescribed medications unless otherwise directed.  If you need a refill on your pain medication, please contact your pharmacy.  They will contact our office to request authorization. Prescriptions will not be filled after 5 P.M. or on weekends.  You should follow a light diet the first few days after arrival home, such as soup and crackers or toast.  Be sure to include plenty of fluids daily.  Most patients will experience some swelling and bruising in the area of the incisions.  Ice packs will help.  Swelling and bruising can take several days to resolve.   It is common to experience some constipation after surgery.  Increasing fluid intake and taking a stool softener (such as Colace) will usually help or prevent this problem from occurring.  A mild laxative (Milk of Magnesia or Miralax) should be taken according to package instructions if there has been no bowel movement after 48 hours.  You will likely have Dermabond (topical glue) over your incisions.  This seals the incisions and allows you to bathe and shower at any time after your surgery.  Glue should remain in place for up to 10 days.  It may be removed after 10 days by pealing off the Dermabond material or using Vaseline or naval jelly to remove.  If you have steri-strips over your incisions, you may remove the gauze bandage on the second day after surgery, and you may shower at that time.  Leave your steri-strips (small skin tapes) in place directly over the incision.  These strips should remain on the  skin for 5-7 days and then be removed.  You may get them wet in the shower and pat them dry.  Any sutures or staples will be removed at the office during your follow-up visit.  ACTIVITIES:  You may resume regular (light) daily activities beginning the next day - such as daily self-care, walking, climbing stairs - gradually increasing activities as tolerated.  You may have sexual intercourse when it is comfortable.  Refrain from any heavy lifting or straining until approved by your doctor.  You may drive when you are no longer taking prescription pain medication, when you can comfortably wear a seatbelt, and when you can safely maneuver your car and apply brakes.  You should see your doctor in the office for a follow-up appointment approximately 2-3 weeks after your surgery.  Make sure that you call for this appointment within a day or two after you arrive home to insure a convenient appointment time.  WHEN TO CALL YOUR DOCTOR: Fever over 101.0 Inability to urinate Continued bleeding from incision Increased pain, redness, or drainage from the incision Increasing abdominal pain  The clinic staff is available to answer your questions during regular business hours.  Please don't hesitate to call and ask to speak to one of the nurses for clinical concerns.  If you have a medical emergency, go to the nearest emergency room or call 911.  A surgeon from [facility] is always on call for the hospital.  Todd Gerkin, MD [facility] Office: [phone] Toll Free:    [phone] FAX [phone]  Website: [email or web address] 

## 2021-05-30 NOTE — Anesthesia Procedure Notes (Signed)
Procedure Name: Intubation ?Date/Time: 05/30/2021 7:25 AM ?Performed by: Elyn Peers, CRNA ?Pre-anesthesia Checklist: Patient identified, Emergency Drugs available, Suction available, Patient being monitored and Timeout performed ?Patient Re-evaluated:Patient Re-evaluated prior to induction ?Oxygen Delivery Method: Circle system utilized ?Preoxygenation: Pre-oxygenation with 100% oxygen ?Induction Type: IV induction ?Ventilation: Mask ventilation without difficulty ?Laryngoscope Size: Hyacinth Meeker and 3 ?Grade View: Grade I ?Tube type: Oral ?Tube size: 7.0 mm ?Number of attempts: 1 ?Airway Equipment and Method: Stylet ?Placement Confirmation: ETT inserted through vocal cords under direct vision, positive ETCO2 and breath sounds checked- equal and bilateral ?Secured at: 21 cm ?Tube secured with: Tape ?Dental Injury: Teeth and Oropharynx as per pre-operative assessment  ? ? ? ? ?

## 2021-05-30 NOTE — Anesthesia Postprocedure Evaluation (Signed)
Anesthesia Post Note ? ?Patient: Christina Peters ? ?Procedure(s) Performed: LAPAROSCOPIC CHOLECYSTECTOMY WITH INTRAOPERATIVE CHOLANGIOGRAM (Abdomen) ? ?  ? ?Patient location during evaluation: PACU ?Anesthesia Type: General ?Level of consciousness: awake and alert ?Pain management: pain level controlled ?Vital Signs Assessment: post-procedure vital signs reviewed and stable ?Respiratory status: spontaneous breathing, nonlabored ventilation, respiratory function stable and patient connected to nasal cannula oxygen ?Cardiovascular status: blood pressure returned to baseline and stable ?Postop Assessment: no apparent nausea or vomiting ?Anesthetic complications: no ? ? ?No notable events documented. ? ?Last Vitals:  ?Vitals:  ? 05/30/21 0945 05/30/21 1000  ?BP: 139/88 (!) 144/87  ?Pulse: (!) 58 (!) 56  ?Resp: 13 13  ?Temp:  36.6 ?C  ?SpO2: 96% 97%  ?  ?Last Pain:  ?Vitals:  ? 05/30/21 1000  ?TempSrc:   ?PainSc: 2   ? ? ?  ?  ?  ?  ?  ?  ? ?Christina Peters ? ? ? ? ?

## 2021-05-31 ENCOUNTER — Encounter (HOSPITAL_COMMUNITY): Payer: Self-pay | Admitting: Surgery

## 2021-05-31 LAB — SURGICAL PATHOLOGY

## 2021-09-20 ENCOUNTER — Other Ambulatory Visit: Payer: Self-pay | Admitting: Osteopathic Medicine

## 2021-10-02 ENCOUNTER — Encounter: Payer: Managed Care, Other (non HMO) | Admitting: Medical-Surgical

## 2022-05-28 ENCOUNTER — Other Ambulatory Visit: Payer: Self-pay | Admitting: Medical-Surgical

## 2022-05-29 NOTE — Telephone Encounter (Signed)
Patient returned call in reference to refills and scheduling an appointment. She stated she got refills from new provider that appointment is no longer needed.

## 2022-08-21 IMAGING — MG MM DIGITAL SCREENING BILAT W/ TOMO AND CAD
6 of 10 series · 6 of 30 positions shown · non-contrast
Comparison: Previous exam(s).

CLINICAL DATA: Screening.

EXAM:
DIGITAL SCREENING BILATERAL MAMMOGRAM WITH TOMOSYNTHESIS AND CAD
TECHNIQUE: Bilateral screening digital craniocaudal and mediolateral oblique
mammograms were obtained. Bilateral screening digital breast
tomosynthesis was performed. The images were evaluated with
computer-aided detection.

[L MLO synth-2D]
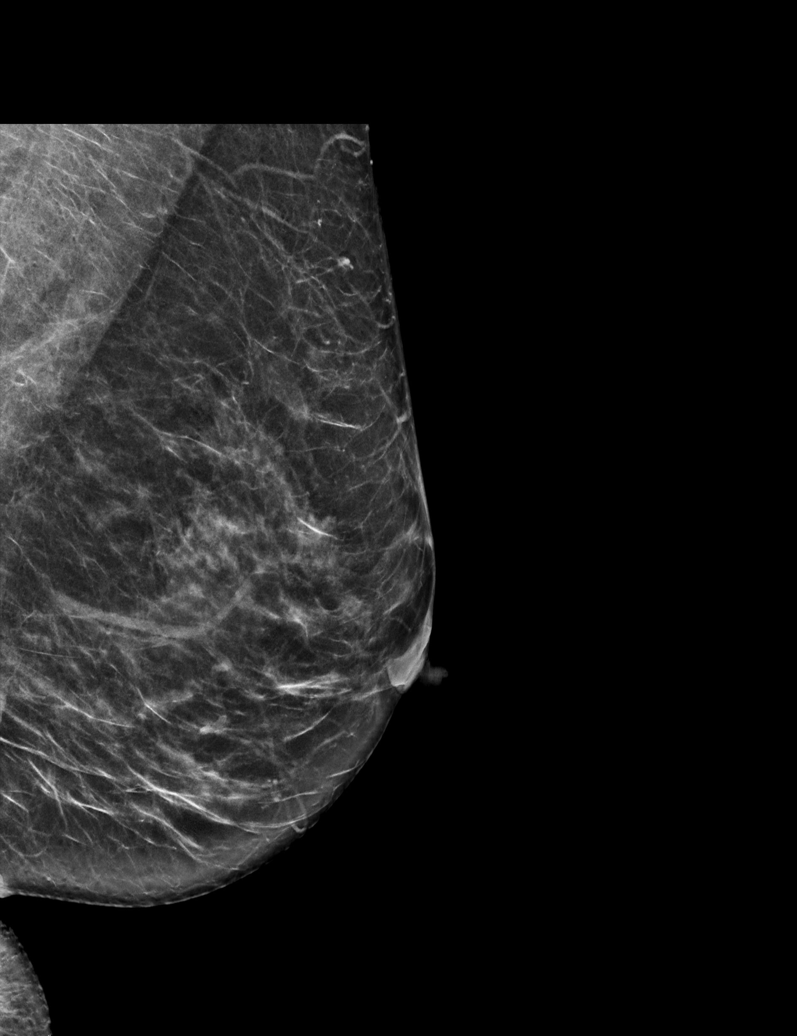

[R MLO synth-2D (1 of 2)]
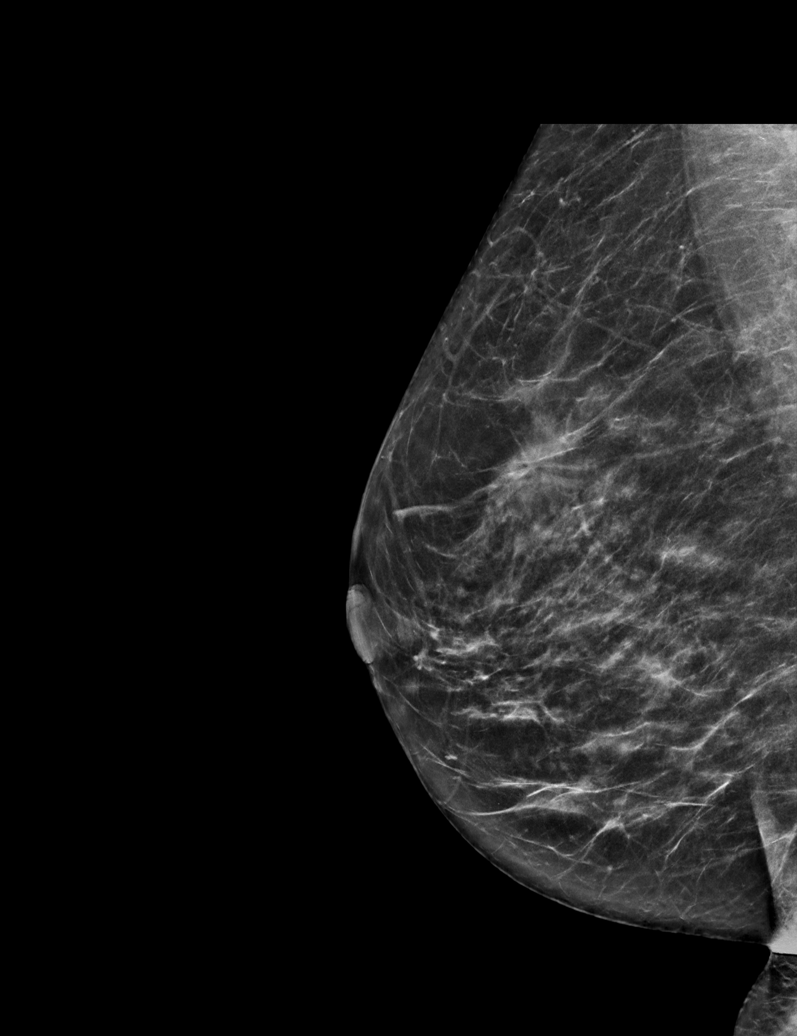

[R MLO synth-2D (2 of 2)]
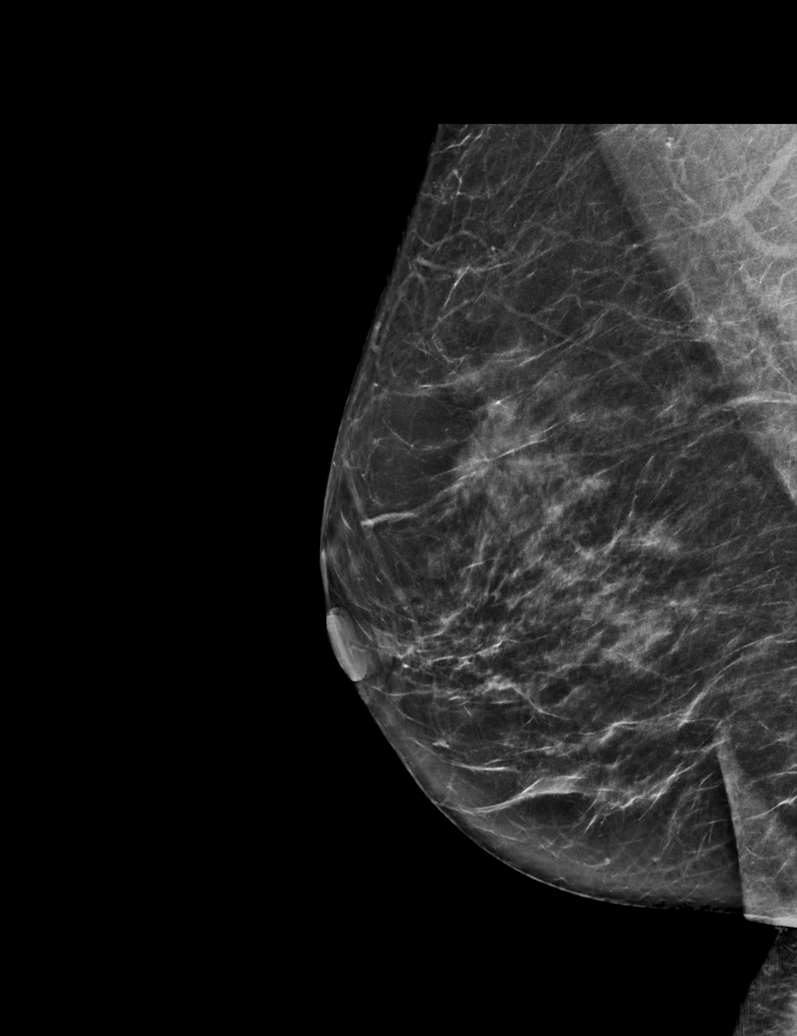

[R CC synth-2D]
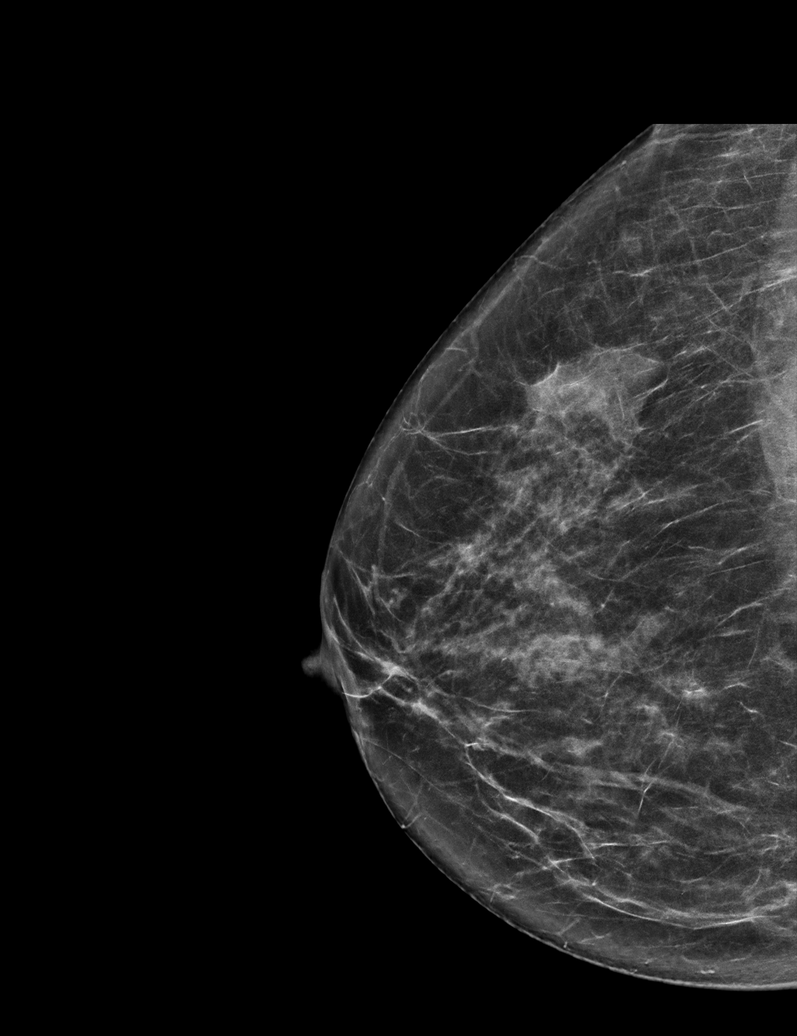

[L CC synth-2D]
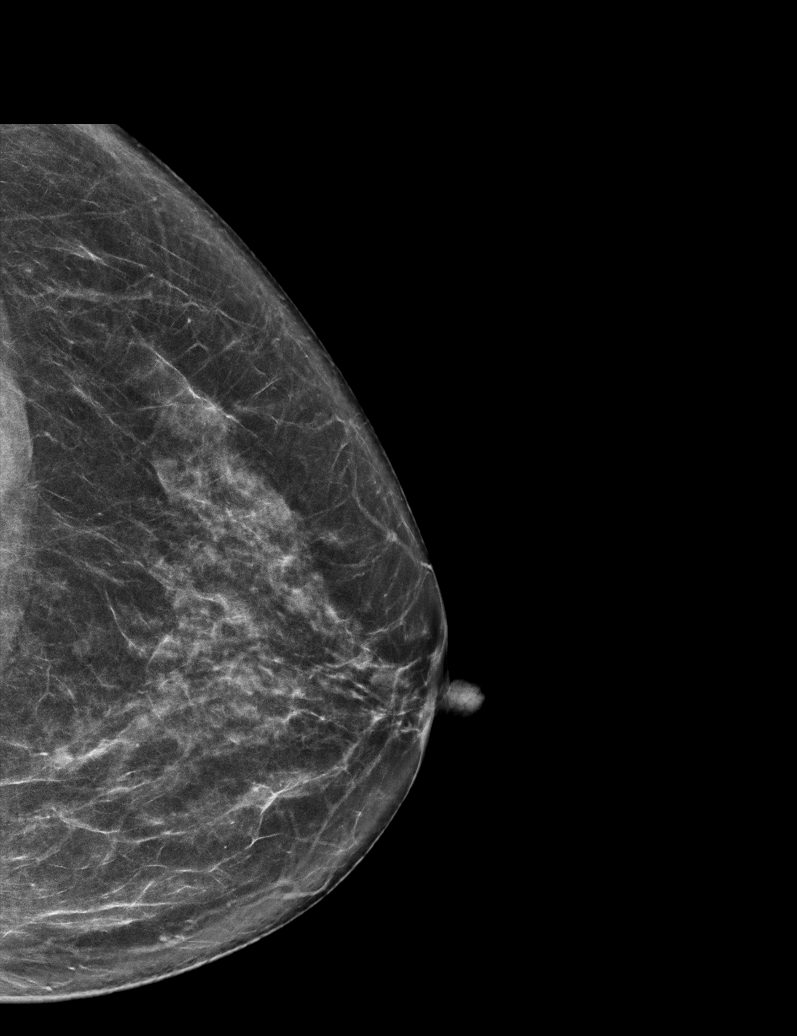

[L MLO tomo · tomo slice 32/63.0]
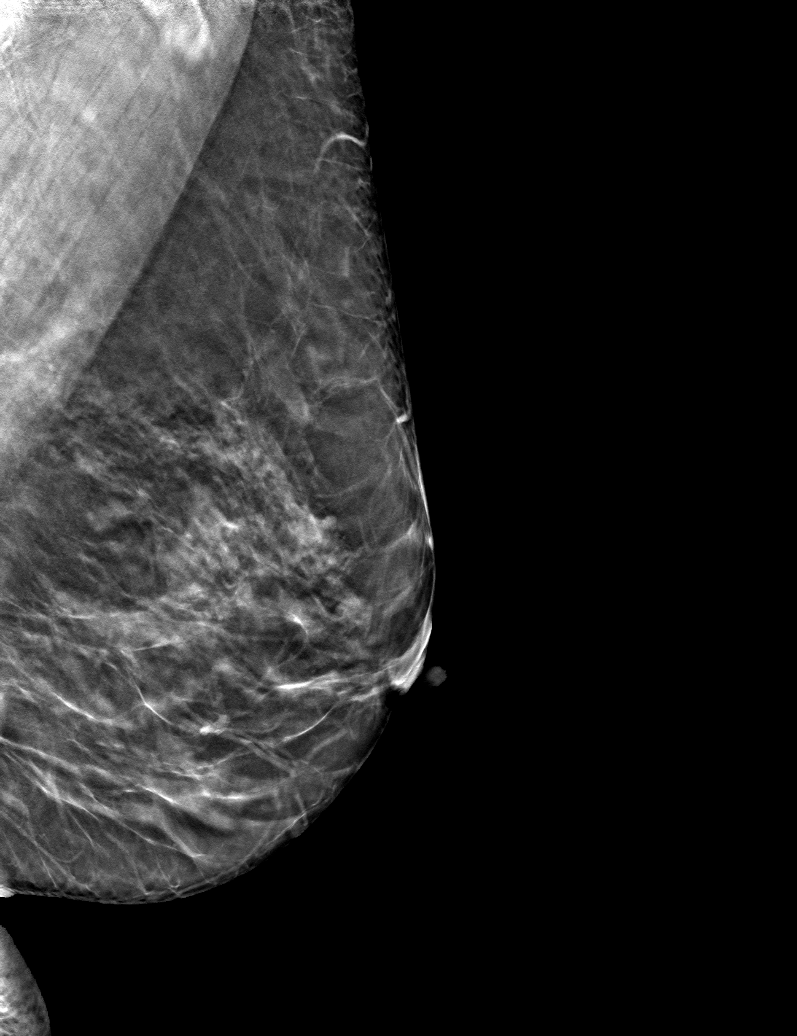

[6 of 30 positions shown; findings below may reference images not displayed]

ACR Breast Density Category c: The breast tissue is heterogeneously
dense, which may obscure small masses.
FINDINGS: There are no findings suspicious for malignancy.
IMPRESSION: No mammographic evidence of malignancy. A result letter of this
screening mammogram will be mailed directly to the patient.

RECOMMENDATION:
Screening mammogram in one year. (Code:Q3-W-BC3)

BI-RADS CATEGORY  1: Negative.

## 2023-05-27 LAB — COLOGUARD: COLOGUARD: NEGATIVE

## 2023-09-30 ENCOUNTER — Telehealth: Payer: Self-pay

## 2023-09-30 NOTE — Telephone Encounter (Signed)
 Copied from CRM #[id number]. Topic: Appointments - Transfer of Care >> Sep 30, 2023  9:02 AM Treva T wrote: Pt is requesting to transfer FROM: Vermell Bologna, PA Pt is requesting to transfer TO: Darice Brownie, FNP Reason for requested transfer: Patient requested It is the responsibility of the team the patient would like to transfer to Elray Brownie, FNP) to reach out to the patient if for any reason this transfer is not acceptable.

## 2023-10-07 ENCOUNTER — Other Ambulatory Visit: Payer: Self-pay

## 2023-10-07 MED ORDER — ESTRADIOL 2 MG PO TABS
2.0000 mg | ORAL_TABLET | Freq: Every day | ORAL | 1 refills | Status: DC
  Filled 2023-10-07: qty 30, 30d supply, fill #0

## 2023-11-03 ENCOUNTER — Encounter (INDEPENDENT_AMBULATORY_CARE_PROVIDER_SITE_OTHER): Payer: Self-pay

## 2023-11-03 ENCOUNTER — Other Ambulatory Visit: Payer: Self-pay | Admitting: Medical Genetics

## 2023-11-10 ENCOUNTER — Other Ambulatory Visit

## 2023-11-11 ENCOUNTER — Other Ambulatory Visit
Admission: RE | Admit: 2023-11-11 | Discharge: 2023-11-11 | Disposition: A | Discharge status: Home / Self Care | Payer: Self-pay | Source: Ambulatory Visit | Attending: Medical Genetics | Admitting: Medical Genetics

## 2023-11-20 LAB — GENECONNECT MOLECULAR SCREEN: Genetic Analysis Overall Interpretation: NEGATIVE

## 2023-12-14 ENCOUNTER — Ambulatory Visit: Admitting: Urgent Care

## 2023-12-14 ENCOUNTER — Ambulatory Visit: Payer: Self-pay

## 2023-12-14 ENCOUNTER — Other Ambulatory Visit: Payer: Self-pay

## 2023-12-14 ENCOUNTER — Ambulatory Visit
Admission: RE | Admit: 2023-12-14 | Discharge: 2023-12-14 | Disposition: A | Discharge status: Home / Self Care | Source: Ambulatory Visit | Attending: Internal Medicine | Admitting: Internal Medicine

## 2023-12-14 VITALS — BP 145/90 | HR 90 | Temp 98.3°F | Resp 18

## 2023-12-14 DIAGNOSIS — R519 Headache, unspecified: Secondary | ICD-10-CM | POA: Diagnosis not present

## 2023-12-14 DIAGNOSIS — R110 Nausea: Secondary | ICD-10-CM

## 2023-12-14 DIAGNOSIS — R1013 Epigastric pain: Secondary | ICD-10-CM

## 2023-12-14 DIAGNOSIS — M791 Myalgia, unspecified site: Secondary | ICD-10-CM

## 2023-12-14 DIAGNOSIS — R197 Diarrhea, unspecified: Secondary | ICD-10-CM

## 2023-12-14 LAB — POC COVID19/FLU A&B COMBO
Covid Antigen, POC: NEGATIVE
Influenza A Antigen, POC: NEGATIVE
Influenza B Antigen, POC: NEGATIVE

## 2023-12-14 MED ORDER — ONDANSETRON HCL 4 MG PO TABS: 4.0000 mg | ORAL_TABLET | Freq: Three times a day (TID) | ORAL | 0 refills | Status: DC | PRN

## 2023-12-14 MED ORDER — ONDANSETRON HCL 4 MG/2ML IJ SOLN
4.0000 mg | Freq: Once | INTRAMUSCULAR | Status: AC
  Administered 2023-12-14: 4 mg via INTRAMUSCULAR

## 2023-12-14 MED ORDER — FAMOTIDINE 20 MG PO TABS
20.0000 mg | ORAL_TABLET | Freq: Two times a day (BID) | ORAL | 0 refills | Status: DC
Start: 2023-12-14 — End: 2023-12-23

## 2023-12-14 NOTE — ED Triage Notes (Signed)
 C/O nausea, headache, chills, body aches. Patient states she awoke this morning with symptoms. C/O decreased appetite. Patient states she had her flu shot last month. Patient C/O upper abdominal pain as well and states she had three bowel movements today which is abnormal for her.

## 2023-12-14 NOTE — Telephone Encounter (Signed)
 FYI Only or Action Required?: FYI only for provider.  Patient was last seen in primary care on 02/13/2021 by Antoniette Vermell CROME, PA-C.  Called Nurse Triage reporting Headache., nausea, weakness, LGF - feels poorly  Symptoms began today.  Interventions attempted: Nothing.  Symptoms are: gradually worsening.  Triage Disposition: See HCP Within 4 Hours (Or PCP Triage)  Patient/caregiver understands and will follow disposition?: Yes - Pt will go to UC - scheduled for pt.                    Copied from CRM [id number]. Topic: Clinical - Red Word Triage >> Dec 14, 2023 11:56 AM Darshell M wrote: Red Word that prompted transfer to Nurse Triage: Patient woke up around midnight with nausea, no vomiting. Body aches and excruciating headache. Weakness. Reason for Disposition  [1] SEVERE headache (e.g., excruciating) AND [2] not improved after 2 hours of pain medicine  Answer Assessment - Initial Assessment Questions 1. LOCATION: Where does it hurt?      Front of head 2. ONSET: When did the headache start? (e.g., minutes, hours, days)      Last night 3. PATTERN: Does the pain come and go, or has it been constant since it started?     constant 4. SEVERITY: How bad is the pain? and What does it keep you from doing?  (e.g., Scale 1-10; mild, moderate, or severe)     8/10 5. RECURRENT SYMPTOM: Have you ever had headaches before? If Yes, ask: When was the last time? and What happened that time?      yes 6. CAUSE: What do you think is causing the headache?     unsure 7. MIGRAINE: Have you been diagnosed with migraine headaches? If Yes, ask: Is this headache similar?      Not been diagnosed - both parents had/have HA 8. HEAD INJURY: Has there been any recent injury to your head?      no 9. OTHER SYMPTOMS: Do you have any other symptoms? (e.g., fever, stiff neck, eye pain, sore throat, cold symptoms)     Nausea, Weakness, diarrhea, 99 temperature, feels war  and BA  Protocols used: Headache-A-AH

## 2023-12-14 NOTE — ED Provider Notes (Signed)
 [facility]  Note:  This document was prepared using Dragon voice recognition software and may include unintentional dictation errors.  MRN: [id number] DOB: 1970/04/18 DATE: 12/14/23   Subjective:  Chief Complaint:  Chief Complaint  Patient presents with   Headache    Severe nausea, fever, diarrhea, body aches, weakness - Entered by patient     HPI: Christina Peters is a 53 y.o. female presenting for nausea and general malaise for less than one day. Patient states at midnight she woke up with intense nausea. She thought she was going to vomit, but never did. She states since then she has had myalgias and chills with general malaise. She also reports a headache as well and intermittent epigastric pain. She also reports 3 loose stools without blood today. She states her husband has been sick as well as recently her grandchild. She reports taking Tums with little relief. She has a history of hysterectomy as well as cholecystectomy. She reports some improvement since this morning in terms of her headache and abdominal pain. She also reports slight improvement in nausea since this morning. Denies fever, vomiting, dysuria, cough, sore throat. Endorses nausea, epigastric pain, chills, myalgias. Presents NAD.  Prior to Admission medications   Medication Sig Start Date End Date Taking? Authorizing Provider  aspirin-acetaminophen -caffeine (EXCEDRIN MIGRAINE) 250-250-65 MG tablet Take 1 tablet by mouth every 6 (six) hours as needed for headache.    [provider]     Allergies  Allergen Reactions   Erythromycin Hives    History:   Past Medical History:  Diagnosis Date   Complication of anesthesia    Heart murmur    as a child   PONV (postoperative nausea and vomiting)      Past Surgical History:  Procedure Laterality Date   CHOLECYSTECTOMY N/A 05/30/2021   Procedure: LAPAROSCOPIC CHOLECYSTECTOMY WITH INTRAOPERATIVE CHOLANGIOGRAM;  Surgeon: Eletha Boas, MD;  Location: [facility];  Service: General;  Laterality: N/A;   TONSILLECTOMY     VAGINAL HYSTERECTOMY      Family History  Problem Relation Age of Onset   High blood pressure Mother    Skin cancer Mother    Heart attack Father    Parkinson's disease Father     Social History   Tobacco Use   Smoking status: Never   Smokeless tobacco: Never  Vaping Use   Vaping status: Never Used  Substance Use Topics   Alcohol use: Never   Drug use: Never    Review of Systems  Constitutional:  Positive for appetite change, chills and fatigue. Negative for fever.  HENT:  Negative for sore throat.   Respiratory:  Negative for cough.   Gastrointestinal:  Positive for abdominal pain, diarrhea and nausea. Negative for blood in stool and vomiting.  Genitourinary:  Negative for dysuria.  Musculoskeletal:  Positive for myalgias.  Neurological:  Positive for headaches.     Objective:   Vitals: BP (!) 145/90 (BP Location: Right Arm)   Pulse 90   Temp 98.3 F (36.8 C) (Oral)   Resp 18   SpO2 97%   Physical Exam Constitutional:      General: She is not in acute distress.    Appearance: Normal appearance. She is well-developed and normal weight. She is not ill-appearing or toxic-appearing.  HENT:     Head: Normocephalic and atraumatic.  Cardiovascular:     Rate and Rhythm: Normal rate and regular rhythm.     Heart sounds: Normal heart sounds.  Pulmonary:  Effort: Pulmonary effort is normal.     Breath sounds: Normal breath sounds.     Comments: Clear to auscultation bilaterally  Abdominal:     General: Bowel sounds are normal.     Palpations: Abdomen is soft.     Tenderness: There is no abdominal tenderness. There is no right CVA tenderness or left CVA tenderness.     Comments: No acute abdomen   Skin:    General: Skin is warm and dry.  Neurological:     General: No focal deficit present.     Mental Status: She is alert.     GCS: GCS eye subscore is 4. GCS verbal subscore is 5. GCS motor  subscore is 6.  Psychiatric:        Mood and Affect: Mood and affect normal.     Results:  Labs: Results for orders placed or performed during the hospital encounter of 12/14/23 (from the past 24 hours)  POC Covid19/Flu A&B Antigen     Status: Normal   Collection Time: 12/14/23  2:20 PM  Result Value Ref Range   Influenza A Antigen, POC Negative Negative   Influenza B Antigen, POC Negative Negative   Covid Antigen, POC Negative Negative    Radiology: No results found.   UC Course/Treatments:  Procedures: Procedures   Medications Ordered in UC: Medications  ondansetron  (ZOFRAN ) injection 4 mg (has no administration in time range)     Assessment and Plan :     ICD-10-CM   1. Nausea without vomiting  R11.0     2. Myalgia  M79.10 POC Covid19/Flu A&B Antigen    POC Covid19/Flu A&B Antigen    3. Acute nonintractable headache, unspecified headache type  R51.9     4. Diarrhea, unspecified type  R19.7      Nausea without vomiting Myalgia Acute Nonintractable Headache, Unspecified Headache Type Diarrhea, unspecified type Epigastric pain Afebrile, nontoxic-appearing, NAD. VSS. DDX includes but not limited to: gastroenteritis, gastritis, appendicitis, pancreatitis, viral etiology, CHS COVID and flu were negative. Patient reports improvement since this morning without intervention. Suspect viral etiology given symptoms and possible recent sick contacts. No acute abdomen on exam. Zofran  4mg  IM was given in office for nausea and patient reported improvement and tolerate oral fluids. Zofran  4mg  PO every 8 hours was prescribed for recurrent nausea/vomiting as well as Pepcid 20mg  BID for gastritis. Recommend BRAT diet and increased fluids. Strict ED precautions were given and patient verbalized understanding.  ED Discharge Orders          Ordered    ondansetron  (ZOFRAN ) 4 MG tablet  Every 8 hours PRN        12/14/23 1433    famotidine (PEPCID) 20 MG tablet  2 times daily         12/14/23 1446             PDMP not reviewed this encounter.     Ekansh Sherk P, PA-C 12/14/23 1447

## 2023-12-14 NOTE — Discharge Instructions (Signed)
 Recommend that you increase oral fluids such as water and Pedialyte. A bland diet is recommend at this time such as the B.R.A.T diet (Broth, Rice, Applesauce, Toast) as tolerated. Zofran is a nausea medication that has been prescribed to you. If you are unable to tolerate fluids despite taking the medication, then it is recommend you go to the ER for further evaluation.

## 2023-12-23 ENCOUNTER — Other Ambulatory Visit: Payer: Self-pay

## 2023-12-23 ENCOUNTER — Ambulatory Visit
Admission: RE | Admit: 2023-12-23 | Discharge: 2023-12-23 | Disposition: A | Discharge status: Home / Self Care | Attending: Internal Medicine | Admitting: Internal Medicine

## 2023-12-23 VITALS — BP 153/81 | HR 78 | Temp 97.7°F | Resp 18

## 2023-12-23 DIAGNOSIS — R300 Dysuria: Secondary | ICD-10-CM

## 2023-12-23 DIAGNOSIS — R350 Frequency of micturition: Secondary | ICD-10-CM

## 2023-12-23 DIAGNOSIS — R3129 Other microscopic hematuria: Secondary | ICD-10-CM

## 2023-12-23 LAB — POCT URINE DIPSTICK
Bilirubin, UA: NEGATIVE
Glucose, UA: NEGATIVE mg/dL
Ketones, POC UA: NEGATIVE mg/dL
Nitrite, UA: NEGATIVE
Protein Ur, POC: NEGATIVE mg/dL
Spec Grav, UA: 1.01 (ref 1.010–1.025)
Urobilinogen, UA: 0.2 U/dL
pH, UA: 7 (ref 5.0–8.0)

## 2023-12-23 MED ORDER — NITROFURANTOIN MONOHYD MACRO 100 MG PO CAPS
100.0000 mg | ORAL_CAPSULE | Freq: Two times a day (BID) | ORAL | 0 refills | Status: DC
Start: 2023-12-23 — End: 2023-12-23
  Filled 2023-12-23: qty 10, 5d supply, fill #0

## 2023-12-23 MED ORDER — NITROFURANTOIN MONOHYD MACRO 100 MG PO CAPS: 100.0000 mg | ORAL_CAPSULE | Freq: Two times a day (BID) | ORAL | 0 refills | Status: AC

## 2023-12-23 NOTE — ED Provider Notes (Signed)
 [facility]  Note:  This document was prepared using Dragon voice recognition software and may include unintentional dictation errors.  MRN: [id number] DOB: [birth year] DATE: 12/23/23   Subjective:  Chief Complaint:  Chief Complaint  Patient presents with   Urinary Frequency    I think I have a urinary tract infection - Entered by patient     HPI: Christina Peters is a 53 y.o. female presenting for dysuria and increased urinary frequency for the past 5 days. Patient states she has had burning with urination and has been urinating more frequently. She has no taken anything for her symptoms, but has increased oral fluids. She also reports slight chills today as well. She was recently diagnosed with a viral infection and reports she was not hydrating well at that time. She states her last UTI was years ago. Denies fever, nausea/vomiting, abdominal pain, back pain, hematuria. Endorses dysuria, urinary frequency. Presents NAD.  Prior to Admission medications   Medication Sig Start Date End Date Taking? Authorizing Provider  nitrofurantoin, macrocrystal-monohydrate, (MACROBID) 100 MG capsule Take 1 capsule (100 mg total) by mouth every 12 (twelve) hours for 5 days. 12/23/23 12/28/23 Yes Seyon Strader P, PA-C  aspirin-acetaminophen -caffeine (EXCEDRIN MIGRAINE) 250-250-65 MG tablet Take 1 tablet by mouth every 6 (six) hours as needed for headache.    [provider]     Allergies  Allergen Reactions   Erythromycin Hives    History:   Past Medical History:  Diagnosis Date   Complication of anesthesia    Heart murmur    as a child   PONV (postoperative nausea and vomiting)      Past Surgical History:  Procedure Laterality Date   CHOLECYSTECTOMY N/A 05/30/2021   Procedure: LAPAROSCOPIC CHOLECYSTECTOMY WITH INTRAOPERATIVE CHOLANGIOGRAM;  Surgeon: Eletha Boas, MD;  Location: [facility];  Service: General;  Laterality: N/A;   TONSILLECTOMY     VAGINAL HYSTERECTOMY       Family History  Problem Relation Age of Onset   High blood pressure Mother    Skin cancer Mother    Heart attack Father    Parkinson's disease Father     Social History   Tobacco Use   Smoking status: Never   Smokeless tobacco: Never  Vaping Use   Vaping status: Never Used  Substance Use Topics   Alcohol use: Never   Drug use: Never    Review of Systems  Constitutional:  Positive for chills. Negative for fever.  Gastrointestinal:  Negative for abdominal pain, nausea and vomiting.  Genitourinary:  Positive for dysuria, frequency and urgency. Negative for flank pain and hematuria.  Musculoskeletal:  Negative for back pain.     Objective:   Vitals: BP (!) 153/81 (BP Location: Right Arm)   Pulse 78   Temp 97.7 F (36.5 C) (Oral)   Resp 18   SpO2 98%   Physical Exam Constitutional:      General: She is not in acute distress.    Appearance: Normal appearance. She is well-developed and normal weight. She is not ill-appearing or toxic-appearing.  HENT:     Head: Normocephalic and atraumatic.  Cardiovascular:     Rate and Rhythm: Normal rate and regular rhythm.     Heart sounds: Normal heart sounds.  Pulmonary:     Effort: Pulmonary effort is normal.     Breath sounds: Normal breath sounds.     Comments: Clear to auscultation bilaterally  Abdominal:     General: Bowel sounds are normal.  Palpations: Abdomen is soft.     Tenderness: There is no abdominal tenderness. There is no right CVA tenderness or left CVA tenderness.     Comments: No acute abdomen   Skin:    General: Skin is warm and dry.  Neurological:     General: No focal deficit present.     Mental Status: She is alert.  Psychiatric:        Mood and Affect: Mood and affect normal.     Results:  Labs: No results found for this or any previous visit (from the past 24 hours).  Radiology: No results found.   UC Course/Treatments:  Procedures: Procedures   Medications Ordered in  UC: Medications - No data to display   Assessment and Plan :     ICD-10-CM   1. Dysuria  R30.0     2. Urinary frequency  R35.0 POCT URINE DIPSTICK    POCT URINE DIPSTICK    3. Other microscopic hematuria  R31.29      Dysuria Urinary frequency Other Microscopic Hematuria  Afebrile, nontoxic-appearing, NAD. VSS. DDX includes but not limited to: Cystitis, pyelonephritis, nephrolithiasis, STD UA was positive for trace blood and a small amount leukocytes.  Urine culture is pending.  Based on symptoms and UA, Macrobid 100 mg twice daily was prescribed.  Will adjust treatment plan based on culture results. Strict ED precautions were given and patient verbalized understanding.  ED Discharge Orders          Ordered    nitrofurantoin, macrocrystal-monohydrate, (MACROBID) 100 MG capsule  2 times daily,   Status:  Discontinued        12/23/23 1926    nitrofurantoin, macrocrystal-monohydrate, (MACROBID) 100 MG capsule  Every 12 hours        12/23/23 1927             PDMP not reviewed this encounter.     Basilia Ulanda SQUIBB, PA-C 12/23/23 1932

## 2023-12-23 NOTE — Discharge Instructions (Addendum)
Your urine sample was consistent with a possible urinary tract infection. You were prescribed Macrobid which is an antibiotic that is often used to treat urinary tract infections. Take the prescription as directed. A urine culture has been sent to the lab for further testing.  We will call you with those results.  If the prescription needs to be changed, it will be done so at that time. Return in 2 to 3 days if no improvement. Please go directly to the Emergency Department immediately should you begin to have any of the following symptoms: persistent fevers, increased pain or persistent nausea/vomiting.

## 2023-12-23 NOTE — ED Triage Notes (Signed)
 C/O urinary frequency, burning with urination and urgency. Onset 5 days ago. Patient has not taken any OTC meds. Denies back pain, fever or chills.

## 2023-12-24 ENCOUNTER — Other Ambulatory Visit: Payer: Self-pay

## 2023-12-25 ENCOUNTER — Ambulatory Visit (HOSPITAL_COMMUNITY): Payer: Self-pay

## 2023-12-25 LAB — URINE CULTURE

## 2023-12-28 ENCOUNTER — Ambulatory Visit

## 2024-02-22 ENCOUNTER — Encounter: Admitting: Obstetrics and Gynecology

## 2024-05-09 ENCOUNTER — Encounter: Payer: Self-pay | Admitting: Family Medicine
